# Patient Record
Sex: Female | Born: 1990 | Race: White | Hispanic: No | Marital: Married | Smoking: Never smoker
Health system: Southern US, Community
[De-identification: ages and names within clinical notes are randomized; demographics above are authoritative.]

## PROBLEM LIST (undated history)

## (undated) DIAGNOSIS — U071 COVID-19: Secondary | ICD-10-CM

## (undated) DIAGNOSIS — J302 Other seasonal allergic rhinitis: Secondary | ICD-10-CM

---
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
## 2011-12-03 ENCOUNTER — Encounter (HOSPITAL_COMMUNITY): Payer: Self-pay | Admitting: Emergency Medicine

## 2011-12-03 ENCOUNTER — Emergency Department (HOSPITAL_COMMUNITY)
Admission: EM | Admit: 2011-12-03 | Discharge: 2011-12-03 | Disposition: A | Discharge status: Home / Self Care | Payer: Worker's Compensation | Attending: Emergency Medicine | Admitting: Emergency Medicine

## 2011-12-03 DIAGNOSIS — M549 Dorsalgia, unspecified: Secondary | ICD-10-CM | POA: Insufficient documentation

## 2011-12-03 DIAGNOSIS — W19XXXA Unspecified fall, initial encounter: Secondary | ICD-10-CM

## 2011-12-03 HISTORY — DX: Other seasonal allergic rhinitis: J30.2

## 2011-12-03 MED ORDER — IBUPROFEN 800 MG PO TABS: 800.0000 mg | ORAL_TABLET | Freq: Three times a day (TID) | ORAL | Status: AC

## 2011-12-03 MED ORDER — HYDROCODONE-ACETAMINOPHEN 5-500 MG PO TABS: 1.0000 | ORAL_TABLET | Freq: Four times a day (QID) | ORAL | Status: AC | PRN

## 2011-12-03 NOTE — ED Provider Notes (Signed)
Medical screening examination/treatment/procedure(s) were performed by non-physician practitioner and as supervising physician I was immediately available for consultation/collaboration.  Juliet Rude. Rubin Payor, MD 12/03/11 2317

## 2011-12-03 NOTE — ED Notes (Signed)
Pt with lab tech for urine drug screen.

## 2011-12-03 NOTE — ED Provider Notes (Signed)
History     CSN: [id number]  Arrival date & time 12/03/11  2038   First MD Initiated Contact with Patient 12/03/11 2052      Chief Complaint  Patient presents with  . Back Pain  . Fall    (Consider location/radiation/quality/duration/timing/severity/associated sxs/prior treatment) HPI Comments: Pt states that she was coming around the corner at work and she fell and landed on her right back:pt c/o pain in the area without radiation  Patient is a 21 y.o. female presenting with back pain. The history is provided by the patient. No language interpreter was used.  Back Pain  This is a new problem. The current episode started 1 to 2 hours ago. The problem occurs constantly. The problem has not changed since onset.The pain is associated with falling. The pain is present in the lumbar spine. The quality of the pain is described as aching. The pain does not radiate. The pain is moderate. The symptoms are aggravated by certain positions. Pertinent negatives include no numbness, no bowel incontinence, no dysuria, no paresthesias, no paresis and no weakness. She has tried nothing for the symptoms.    Past Medical History  Diagnosis Date  . Seasonal allergies     History reviewed. No pertinent past surgical history.  No family history on file.  History  Substance Use Topics  . Smoking status: Never Smoker   . Smokeless tobacco: Not on file  . Alcohol Use: No    OB History    Grav Para Term Preterm Abortions TAB SAB Ect Mult Living                  Review of Systems  Respiratory: Negative.   Cardiovascular: Negative.   Gastrointestinal: Negative for bowel incontinence.  Genitourinary: Negative for dysuria.  Musculoskeletal: Positive for back pain.  Neurological: Negative for weakness, numbness and paresthesias.    Allergies  Amoxicillin  Home Medications   Current Outpatient Rx  Name Route Sig Dispense Refill  . LORATADINE 10 MG PO TABS Oral Take 10 mg by mouth daily.     Marland Kitchen ONDANSETRON HCL 4 MG PO TABS Oral Take 4 mg by mouth every 8 (eight) hours as needed. For nausea      BP 132/80  Pulse 105  Temp(Src) 98 F (36.7 C) (Oral)  Resp 20  SpO2 100%  LMP 11/26/2011  Physical Exam  Nursing note and vitals reviewed. Constitutional: She is oriented to person, place, and time. She appears well-developed and well-nourished.  Cardiovascular: Normal rate and regular rhythm.   Pulmonary/Chest: Effort normal and breath sounds normal.  Musculoskeletal:       Arms: Neurological: She is alert and oriented to person, place, and time.  Skin: Skin is warm and dry.    ED Course  Procedures (including critical care time)  Labs Reviewed - No data to display No results found.   1. Fall   2. Back pain       MDM  Pt not having any deficit:dont think imaging is needed is needed at this time as pt is having paraspinal tenderness:pt had uds done:will treat symptomatically for pain        Teressa Lower, NP 12/03/11 2247

## 2011-12-03 NOTE — ED Notes (Signed)
Pt slipped and fell in kitchen at work just pta.  States L foot slipped out in front of her and she landed on R back.  C/o pain to R lower back and L ankle that radiates up posterior L lower leg.Ambulatory to triage.  Denies LOC.

## 2011-12-03 NOTE — Discharge Instructions (Signed)
Back Exercises Back exercises help treat and prevent back injuries. The goal of back exercises is to increase the strength of your abdominal and back muscles and the flexibility of your back. These exercises should be started when you no longer have back pain. Back exercises include:  Pelvic Tilt. Lie on your back with your knees bent. Tilt your pelvis until the lower part of your back is against the floor. Hold this position 5 to 10 sec and repeat 5 to 10 times.   Knee to Chest. Pull first 1 knee up against your chest and hold for 20 to 30 seconds, repeat this with the other knee, and then both knees. This may be done with the other leg straight or bent, whichever feels better.   Sit-Ups or Curl-Ups. Bend your knees 90 degrees. Start with tilting your pelvis, and do a partial, slow sit-up, lifting your trunk only 30 to 45 degrees off the floor. Take at least 2 to 3 seconds for each sit-up. Do not do sit-ups with your knees out straight. If partial sit-ups are difficult, simply do the above but with only tightening your abdominal muscles and holding it as directed.   Hip-Lift. Lie on your back with your knees flexed 90 degrees. Push down with your feet and shoulders as you raise your hips a couple inches off the floor; hold for 10 seconds, repeat 5 to 10 times.   Back arches. Lie on your stomach, propping yourself up on bent elbows. Slowly press on your hands, causing an arch in your low back. Repeat 3 to 5 times. Any initial stiffness and discomfort should lessen with repetition over time.   Shoulder-Lifts. Lie face down with arms beside your body. Keep hips and torso pressed to floor as you slowly lift your head and shoulders off the floor.  Do not overdo your exercises, especially in the beginning. Exercises may cause you some mild back discomfort which lasts for a few minutes; however, if the pain is more severe, or lasts for more than 15 minutes, do not continue exercises until you see your  caregiver. Improvement with exercise therapy for back problems is slow.  See your caregivers for assistance with developing a proper back exercise program. Document Released: 08/24/2004 Document Revised: 07/06/2011 Document Reviewed: 07/17/2005 ExitCare Patient Information 2012 [employer]. 

## 2019-01-28 DIAGNOSIS — N970 Female infertility associated with anovulation: Secondary | ICD-10-CM | POA: Insufficient documentation

## 2019-01-29 HISTORY — PX: APPENDECTOMY: SHX54

## 2019-08-01 DIAGNOSIS — F419 Anxiety disorder, unspecified: Secondary | ICD-10-CM | POA: Insufficient documentation

## 2019-08-01 DIAGNOSIS — F32A Depression, unspecified: Secondary | ICD-10-CM | POA: Insufficient documentation

## 2019-08-01 HISTORY — DX: Anxiety disorder, unspecified: F41.9

## 2019-08-01 HISTORY — DX: Depression, unspecified: F32.A

## 2019-08-19 ENCOUNTER — Ambulatory Visit: Payer: Self-pay | Admitting: Family Medicine

## 2019-08-26 ENCOUNTER — Ambulatory Visit: Payer: Self-pay | Admitting: Family Medicine

## 2019-09-22 ENCOUNTER — Encounter: Payer: Self-pay | Admitting: Family Medicine

## 2019-09-22 ENCOUNTER — Other Ambulatory Visit: Payer: Self-pay

## 2019-09-22 ENCOUNTER — Ambulatory Visit (INDEPENDENT_AMBULATORY_CARE_PROVIDER_SITE_OTHER): Payer: 59 | Admitting: Family Medicine

## 2019-09-22 VITALS — BP 112/68 | HR 107 | Temp 97.9°F | Ht <= 58 in | Wt 148.8 lb

## 2019-09-22 DIAGNOSIS — M545 Low back pain, unspecified: Secondary | ICD-10-CM

## 2019-09-22 DIAGNOSIS — Z23 Encounter for immunization: Secondary | ICD-10-CM | POA: Diagnosis not present

## 2019-09-22 DIAGNOSIS — Z0000 Encounter for general adult medical examination without abnormal findings: Secondary | ICD-10-CM

## 2019-09-22 LAB — URINALYSIS, ROUTINE W REFLEX MICROSCOPIC
Bilirubin Urine: NEGATIVE
Hgb urine dipstick: NEGATIVE
Ketones, ur: NEGATIVE
Leukocytes,Ua: NEGATIVE
Nitrite: NEGATIVE
RBC / HPF: NONE SEEN (ref 0–?)
Specific Gravity, Urine: 1.015 (ref 1.000–1.030)
Total Protein, Urine: NEGATIVE
Urine Glucose: NEGATIVE
Urobilinogen, UA: 0.2 (ref 0.0–1.0)
WBC, UA: NONE SEEN (ref 0–?)
pH: 7.5 (ref 5.0–8.0)

## 2019-09-22 NOTE — Progress Notes (Addendum)
New Patient Office Visit  Subjective:  Patient ID: Claudia Davis, female    DOB: 1991/04/29  Age: 29 y.o. MRN: [id number]  CC:  Chief Complaint  Patient presents with  . Establish Care    New patient, c/o lower back pain x 5 months. Per pt she feels like there is something going on tih her kidneys.     HPI SHARICA ROEDEL presents for a 3 to 41-month history of intermittent lower back pain urinary frequency and urgency.  Back pain occasionally radiates around her thighs into her lower abdominal area.  There is no radiation of pain down the legs numbness tingling or weakness.  Patient denies dysuria unusual vaginal discharge nausea vomiting fever chills.  There is been no injury to her lower back.  She is currently not using any form of contraception, LMP 2/14.  Pregnancy would be okay at this point.  She admits to feeling anxious and somewhat depressed with the pandemic.  Mom suffers from anxiety and depression and is currently taking medication.  Patient works at [employer].  Past Medical History:  Diagnosis Date  . Seasonal allergies     Past Surgical History:  Procedure Laterality Date  . APPENDECTOMY  01/2019    Family History  Problem Relation Age of Onset  . Anxiety disorder Mother   . Hypertension Father     Social History   Socioeconomic History  . Marital status: Married    Spouse name: Not on file  . Number of children: Not on file  . Years of education: Not on file  . Highest education level: Not on file  Occupational History  . Not on file  Tobacco Use  . Smoking status: Never Smoker  Substance and Sexual Activity  . Alcohol use: No  . Drug use: No  . Sexual activity: Yes  Other Topics Concern  . Not on file  Social History Narrative  . Not on file   Social Determinants of Health   Financial Resource Strain:   . Difficulty of Paying Living Expenses: Not on file  Food Insecurity:   . Worried About Charity fundraiser in the Last Year: Not on  file  . Ran Out of Food in the Last Year: Not on file  Transportation Needs:   . Lack of Transportation (Medical): Not on file  . Lack of Transportation (Non-Medical): Not on file  Physical Activity:   . Days of Exercise per Week: Not on file  . Minutes of Exercise per Session: Not on file  Stress:   . Feeling of Stress : Not on file  Social Connections:   . Frequency of Communication with Friends and Family: Not on file  . Frequency of Social Gatherings with Friends and Family: Not on file  . Attends Religious Services: Not on file  . Active Member of Clubs or Organizations: Not on file  . Attends Archivist Meetings: Not on file  . Marital Status: Not on file  Intimate Partner Violence:   . Fear of Current or Ex-Partner: Not on file  . Emotionally Abused: Not on file  . Physically Abused: Not on file  . Sexually Abused: Not on file    ROS Review of Systems  Constitutional: Negative for diaphoresis, fatigue, fever and unexpected weight change.  Eyes: Negative for photophobia and visual disturbance.  Respiratory: Negative.   Cardiovascular: Negative.   Genitourinary: Positive for frequency and urgency. Negative for difficulty urinating, dysuria, hematuria and vaginal discharge.  Musculoskeletal:  Positive for back pain. Negative for myalgias.  Skin: Negative.   Neurological: Negative for weakness and numbness.  Psychiatric/Behavioral: Positive for dysphoric mood. The patient is nervous/anxious.    Depression screen [facility] 2/9 09/22/2019  Decreased Interest 1  Down, Depressed, Hopeless 1  PHQ - 2 Score 2  Altered sleeping 1  Tired, decreased energy 1  Change in appetite 0  Feeling bad or failure about yourself  0  Trouble concentrating 0  Moving slowly or fidgety/restless 0  Suicidal thoughts 0  PHQ-9 Score 4  Difficult doing work/chores Somewhat difficult    Objective:   Today's Vitals: BP 112/68   Pulse (!) 107   Temp 97.9 F (36.6 C) (Tympanic)   Ht 4'  10" (1.473 m)   Wt 148 lb 12.8 oz (67.5 kg)   SpO2 99%   BMI 31.10 kg/m   Physical Exam Vitals and nursing note reviewed.  Constitutional:      General: She is not in acute distress.    Appearance: Normal appearance. She is normal weight. She is not ill-appearing, toxic-appearing or diaphoretic.  HENT:     Head: Normocephalic and atraumatic.     Right Ear: External ear normal.     Left Ear: External ear normal.  Eyes:     General: No scleral icterus.       Right eye: No discharge.        Left eye: No discharge.  Cardiovascular:     Rate and Rhythm: Normal rate and regular rhythm.  Pulmonary:     Effort: Pulmonary effort is normal.     Breath sounds: Normal breath sounds.  Abdominal:     General: Abdomen is flat. Bowel sounds are normal. There is no distension.     Palpations: Abdomen is soft. There is no mass.     Tenderness: There is no abdominal tenderness. There is no guarding or rebound.     Hernia: No hernia is present.  Musculoskeletal:     Lumbar back: No swelling, edema, deformity, signs of trauma, lacerations or spasms. Normal range of motion. No scoliosis.  Neurological:     Mental Status: She is alert and oriented to person, place, and time.     Motor: Motor function is intact.     Comments: Neg dural tension signs.   Psychiatric:        Mood and Affect: Mood normal.        Behavior: Behavior normal.     Assessment & Plan:   Problem List Items Addressed This Visit    None    Visit Diagnoses    Need for Tdap vaccination    -  Primary   Relevant Orders   Tdap vaccine greater than or equal to 7yo IM (Completed)   Midline low back pain without sciatica, unspecified chronicity       Relevant Orders   Urinalysis, Routine w reflex microscopic (Completed)   Urine Culture   Pregnancy, urine (Completed)   Healthcare maintenance       Relevant Medications   Prenatal Multivit-Min-Fe-FA (PRE-NATAL FORMULA) TABS   Other Relevant Orders   Ambulatory referral to  Obstetrics / Gynecology      Outpatient Encounter Medications as of 09/22/2019  Medication Sig  . loratadine (CLARITIN) 10 MG tablet Take 10 mg by mouth daily.  . ondansetron (ZOFRAN) 4 MG tablet Take 4 mg by mouth every 8 (eight) hours as needed. For nausea  . Prenatal Multivit-Min-Fe-FA (PRE-NATAL FORMULA) TABS Take one daily.  No facility-administered encounter medications on file as of 09/22/2019.    Follow-up: Return in about 2 weeks (around 10/06/2019).   Mliss Sax, MD

## 2019-09-23 ENCOUNTER — Telehealth: Payer: Self-pay

## 2019-09-23 ENCOUNTER — Telehealth: Payer: Self-pay | Admitting: Family Medicine

## 2019-09-23 DIAGNOSIS — R110 Nausea: Secondary | ICD-10-CM

## 2019-09-23 LAB — URINE CULTURE
MICRO NUMBER:: 10173328
SPECIMEN QUALITY:: ADEQUATE

## 2019-09-23 LAB — PREGNANCY, URINE: Preg Test, Ur: NEGATIVE

## 2019-09-23 MED ORDER — PRE-NATAL FORMULA PO TABS: ORAL_TABLET | ORAL | 2 refills | Status: AC

## 2019-09-23 NOTE — Addendum Note (Signed)
Addended by: Andrez Grime on: 09/23/2019 09:50 AM   Modules accepted: Orders

## 2019-09-23 NOTE — Telephone Encounter (Signed)
Patient calling with questions regarding prenatal pills. Informed patient that the Rx was sent to the pharmacy because she had let doctor know that she was working on getting pregnant pt understood this. States that she would like Zofran sent to pharmacy for nausea. Please advise.

## 2019-09-23 NOTE — Telephone Encounter (Signed)
Patient is returning the call for results. CB is [phone]

## 2019-09-24 MED ORDER — ONDANSETRON HCL 4 MG PO TABS: 4.0000 mg | ORAL_TABLET | Freq: Three times a day (TID) | ORAL | 0 refills | Status: DC | PRN

## 2019-09-24 NOTE — Telephone Encounter (Signed)
Done

## 2019-10-06 ENCOUNTER — Ambulatory Visit: Payer: 59 | Admitting: Family Medicine

## 2019-10-06 ENCOUNTER — Other Ambulatory Visit: Payer: Self-pay

## 2019-10-06 ENCOUNTER — Encounter: Payer: Self-pay | Admitting: Family Medicine

## 2019-10-06 VITALS — BP 102/68 | HR 93 | Temp 98.6°F | Ht <= 58 in | Wt 147.0 lb

## 2019-10-06 DIAGNOSIS — F4329 Adjustment disorder with other symptoms: Secondary | ICD-10-CM | POA: Insufficient documentation

## 2019-10-06 NOTE — Patient Instructions (Signed)
Mindfulness-Based Stress Reduction Mindfulness-based stress reduction (MBSR) is a program that helps people learn to practice mindfulness. Mindfulness is the practice of intentionally paying attention to the present moment. It can be learned and practiced through techniques such as education, breathing exercises, meditation, and yoga. MBSR includes several mindfulness techniques in one program. MBSR works best when you understand the treatment, are willing to try new things, and can commit to spending time practicing what you learn. MBSR training may include learning about:  How your emotions, thoughts, and reactions affect your body.  New ways to respond to things that cause negative thoughts to start (triggers).  How to notice your thoughts and let go of them.  Practicing awareness of everyday things that you normally do without thinking.  The techniques and goals of different types of meditation. What are the benefits of MBSR? MBSR can have many benefits, which include helping you to:  Develop self-awareness. This refers to knowing and understanding yourself.  Learn skills and attitudes that help you to participate in your own health care.  Learn new ways to care for yourself.  Be more accepting about how things are, and let things go.  Be less judgmental and approach things with an open mind.  Be patient with yourself and trust yourself more. MBSR has also been shown to:  Reduce negative emotions, such as depression and anxiety.  Improve memory and focus.  Change how you sense and approach pain.  Boost your body's ability to fight infections.  Help you connect better with other people.  Improve your sense of well-being. Follow these instructions at home:   Find a local in-person or online MBSR program.  Set aside some time regularly for mindfulness practice.  Find a mindfulness practice that works best for you. This may include one or more of the following: ?  Meditation. Meditation involves focusing your mind on a certain thought or activity. ? Breathing awareness exercises. These help you to stay present by focusing on your breath. ? Body scan. For this practice, you lie down and pay attention to each part of your body from head to toe. You can identify tension and soreness and intentionally relax parts of your body. ? Yoga. Yoga involves stretching and breathing, and it can improve your ability to move and be flexible. It can also provide an experience of testing your body's limits, which can help you release stress. ? Mindful eating. This way of eating involves focusing on the taste, texture, color, and smell of each bite of food. Because this slows down eating and helps you feel full sooner, it can be an important part of a weight-loss plan.  Find a podcast or recording that provides guidance for breathing awareness, body scan, or meditation exercises. You can listen to these any time when you have a free moment to rest without distractions.  Follow your treatment plan as told by your health care provider. This may include taking regular medicines and making changes to your diet or lifestyle as recommended. How to practice mindfulness To do a basic awareness exercise:  Find a comfortable place to sit.  Pay attention to the present moment. Observe your thoughts, feelings, and surroundings just as they are.  Avoid placing judgment on yourself, your feelings, or your surroundings. Make note of any judgment that comes up, and let it go.  Your mind may wander, and that is okay. Make note of when your thoughts drift, and return your attention to the present moment. To do   basic mindfulness meditation:  Find a comfortable place to sit. This may include a stable chair or a firm floor cushion. ? Sit upright with your back straight. Let your arms fall next to your side with your hands resting on your legs. ? If sitting in a chair, rest your feet flat on  the floor. ? If sitting on a cushion, cross your legs in front of you.  Keep your head in a neutral position with your chin dropped slightly. Relax your jaw and rest the tip of your tongue on the roof of your mouth. Drop your gaze to the floor. You can close your eyes if you like.  Breathe normally and pay attention to your breath. Feel the air moving in and out of your nose. Feel your belly expanding and relaxing with each breath.  Your mind may wander, and that is okay. Make note of when your thoughts drift, and return your attention to your breath.  Avoid placing judgment on yourself, your feelings, or your surroundings. Make note of any judgment or feelings that come up, let them go, and bring your attention back to your breath.  When you are ready, lift your gaze or open your eyes. Pay attention to how your body feels after the meditation. Where to find more information You can find more information about MBSR from:  Your health care provider.  Community-based meditation centers or programs.  Programs offered near you. Summary  Mindfulness-based stress reduction (MBSR) is a program that teaches you how to intentionally pay attention to the present moment. It is used with other treatments to help you cope better with daily stress, emotions, and pain.  MBSR focuses on developing self-awareness, which allows you to respond to life stress without judgment or negative emotions.  MBSR programs may involve learning different mindfulness practices, such as breathing exercises, meditation, yoga, body scan, or mindful eating. Find a mindfulness practice that works best for you, and set aside time for it on a regular basis. This information is not intended to replace advice given to you by your health care provider. Make sure you discuss any questions you have with your health care provider. Document Revised: 06/29/2017 Document Reviewed: 11/23/2016 Elsevier Patient Education  2020 [employer].  Exercising to Stay Healthy To become healthy and stay healthy, it is recommended that you do moderate-intensity and vigorous-intensity exercise. You can tell that you are exercising at a moderate intensity if your heart starts beating faster and you start breathing faster but can still hold a conversation. You can tell that you are exercising at a vigorous intensity if you are breathing much harder and faster and cannot hold a conversation while exercising. Exercising regularly is important. It has many health benefits, such as:  Improving overall fitness, flexibility, and endurance.  Increasing bone density.  Helping with weight control.  Decreasing body fat.  Increasing muscle strength.  Reducing stress and tension.  Improving overall health. How often should I exercise? Choose an activity that you enjoy, and set realistic goals. Your health care provider can help you make an activity plan that works for you. Exercise regularly as told by your health care provider. This may include:  Doing strength training two times a week, such as: ? Lifting weights. ? Using resistance bands. ? Push-ups. ? Sit-ups. ? Yoga.  Doing a certain intensity of exercise for a given amount of time. Choose from these options: ? A total of 150 minutes of moderate-intensity exercise every week. ? A  total of 75 minutes of vigorous-intensity exercise every week. ? A mix of moderate-intensity and vigorous-intensity exercise every week. Children, pregnant women, people who have not exercised regularly, people who are overweight, and older adults may need to talk with a health care provider about what activities are safe to do. If you have a medical condition, be sure to talk with your health care provider before you start a new exercise program. What are some exercise ideas? Moderate-intensity exercise ideas include:  Walking 1 mile (1.6 km) in about 15 minutes.  Biking.  Hiking.  Golfing.   Dancing.  Water aerobics. Vigorous-intensity exercise ideas include:  Walking 4.5 miles (7.2 km) or more in about 1 hour.  Jogging or running 5 miles (8 km) in about 1 hour.  Biking 10 miles (16.1 km) or more in about 1 hour.  Lap swimming.  Roller-skating or in-line skating.  Cross-country skiing.  Vigorous competitive sports, such as football, basketball, and soccer.  Jumping rope.  Aerobic dancing. What are some everyday activities that can help me to get exercise?  [place] work, such as: ? Pushing a Conservation officer, nature. ? Raking and bagging leaves.  Washing your car.  Pushing a stroller.  Shoveling snow.  Gardening.  Washing windows or floors. How can I be more active in my day-to-day activities?  Use stairs instead of an elevator.  Take a walk during your lunch break.  If you drive, park your car farther away from your work or school.  If you take public transportation, get off one stop early and walk the rest of the way.  Stand up or walk around during all of your indoor phone calls.  Get up, stretch, and walk around every 30 minutes throughout the day.  Enjoy exercise with a friend. Support to continue exercising will help you keep a regular routine of activity. What guidelines can I follow while exercising?  Before you start a new exercise program, talk with your health care provider.  Do not exercise so much that you hurt yourself, feel dizzy, or get very short of breath.  Wear comfortable clothes and wear shoes with good support.  Drink plenty of water while you exercise to prevent dehydration or heat stroke.  Work out until your breathing and your heartbeat get faster. Where to find more information  [place].S[place] Department of Health and Human Services: [employer]  Centers for Disease Control and Prevention (CDC): http://www.wolf.info/ Summary  Exercising regularly is important. It will improve your overall fitness, flexibility, and endurance.  Regular exercise  also will improve your overall health. It can help you control your weight, reduce stress, and improve your bone density.  Do not exercise so much that you hurt yourself, feel dizzy, or get very short of breath.  Before you start a new exercise program, talk with your health care provider. This information is not intended to replace advice given to you by your health care provider. Make sure you discuss any questions you have with your health care provider. Document Revised: 06/29/2017 Document Reviewed: 06/07/2017 Elsevier Patient Education  2020 [employer].

## 2019-10-06 NOTE — Progress Notes (Signed)
Established Patient Office Visit  Subjective:  Patient ID: Claudia Davis, female    DOB: 20-Apr-1991  Age: 29 y.o. MRN: [id number]  CC:  Chief Complaint  Patient presents with  . Follow-up    2 week follow up patient states that her back pains have subsided she would like to start on somethng for anxiety.     HPI [name] presents for follow-up of her back pain and stress.  Back pain has improved.  She would like to discuss treatment options for her stress and anxiety.  This is been more recent problem for her as an adult.  She is married and lives with her husband.  They have no children.  They are currently trying for pregnancy.  Husband works out of town throughout the week and is home on the weekends.  She is otherwise alone but recognizes this is of stressor for her.  There are no problems with the marriage she tells me.  Her mom has problems with stress and anxiety as well.  Patient does not smoke, drink alcohol or use illicit drugs.  She works full-time at [employer].  Past Medical History:  Diagnosis Date  . Seasonal allergies     Past Surgical History:  Procedure Laterality Date  . APPENDECTOMY  01/2019    Family History  Problem Relation Age of Onset  . Anxiety disorder Mother   . Hypertension Father     Social History   Socioeconomic History  . Marital status: Married    Spouse name: Not on file  . Number of children: Not on file  . Years of education: Not on file  . Highest education level: Not on file  Occupational History  . Not on file  Tobacco Use  . Smoking status: Never Smoker  Substance and Sexual Activity  . Alcohol use: No  . Drug use: No  . Sexual activity: Yes  Other Topics Concern  . Not on file  Social History Narrative  . Not on file   Social Determinants of Health   Financial Resource Strain:   . Difficulty of Paying Living Expenses: Not on file  Food Insecurity:   . Worried About Charity fundraiser in the Last Year:  Not on file  . Ran Out of Food in the Last Year: Not on file  Transportation Needs:   . Lack of Transportation (Medical): Not on file  . Lack of Transportation (Non-Medical): Not on file  Physical Activity:   . Days of Exercise per Week: Not on file  . Minutes of Exercise per Session: Not on file  Stress:   . Feeling of Stress : Not on file  Social Connections:   . Frequency of Communication with Friends and Family: Not on file  . Frequency of Social Gatherings with Friends and Family: Not on file  . Attends Religious Services: Not on file  . Active Member of Clubs or Organizations: Not on file  . Attends Archivist Meetings: Not on file  . Marital Status: Not on file  Intimate Partner Violence:   . Fear of Current or Ex-Partner: Not on file  . Emotionally Abused: Not on file  . Physically Abused: Not on file  . Sexually Abused: Not on file    Outpatient Medications Prior to Visit  Medication Sig Dispense Refill  . loratadine (CLARITIN) 10 MG tablet Take 10 mg by mouth daily.    . ondansetron (ZOFRAN) 4 MG tablet Take 1 tablet (4 mg  total) by mouth every 8 (eight) hours as needed. For nausea 20 tablet 0  . Prenatal Multivit-Min-Fe-FA (PRE-NATAL FORMULA) TABS Take one daily. 30 tablet 2   No facility-administered medications prior to visit.    Allergies  Allergen Reactions  . Amoxicillin Swelling    Throat swells     ROS Review of Systems  Constitutional: Negative.   HENT: Negative.   Respiratory: Negative.   Cardiovascular: Negative.   Gastrointestinal: Negative.   Genitourinary: Negative.   Musculoskeletal: Negative for back pain, gait problem and myalgias.  Neurological: Negative for weakness and numbness.  Psychiatric/Behavioral: Positive for dysphoric mood. The patient is nervous/anxious.       Objective:    Physical Exam  Constitutional: She is oriented to person, place, and time. She appears well-developed and well-nourished.  HENT:  Head:  Atraumatic.  Right Ear: External ear normal.  Left Ear: External ear normal.  Eyes: Conjunctivae are normal. Right eye exhibits no discharge. Left eye exhibits no discharge. No scleral icterus.  Neck: No JVD present. No tracheal deviation present.  Pulmonary/Chest: Effort normal. No stridor.  Neurological: She is alert and oriented to person, place, and time.  Psychiatric: She has a normal mood and affect. Her behavior is normal.    BP 102/68   Pulse 93   Temp 98.6 F (37 C) (Tympanic)   Ht '4\' 10"'$  (1.473 m)   Wt 147 lb (66.7 kg)   SpO2 100%   BMI 30.72 kg/m  Wt Readings from Last 3 Encounters:  10/06/19 147 lb (66.7 kg)  09/22/19 148 lb 12.8 oz (67.5 kg)     Health Maintenance Due  Topic Date Due  . HIV Screening  09/05/2006  . PAP-Cervical Cytology Screening  09/05/2012  . PAP SMEAR-Modifier  09/05/2012    There are no preventive care reminders to display for this patient.  No results found for: TSH No results found for: WBC, HGB, HCT, MCV, PLT No results found for: NA, K, CHLORIDE, CO2, GLUCOSE, BUN, CREATININE, BILITOT, ALKPHOS, AST, ALT, PROT, ALBUMIN, CALCIUM, ANIONGAP, EGFR, GFR No results found for: CHOL No results found for: HDL No results found for: LDLCALC No results found for: TRIG No results found for: CHOLHDL No results found for: HGBA1C    Assessment & Plan:   Problem List Items Addressed This Visit      Other   Stress and adjustment reaction - Primary   Relevant Orders   Ambulatory referral to Psychology      No orders of the defined types were placed in this encounter.   Follow-up: Return in about 3 months (around 01/06/2020).   Patient was given information on mindfulness based stress reduction as well as exercise.  Encouraged her to pursue these options.  She also agrees to pursue talking therapy.  Follow-up will be in 3 months.  Libby Maw, MD

## 2019-11-19 ENCOUNTER — Other Ambulatory Visit: Payer: Self-pay

## 2019-11-19 ENCOUNTER — Encounter: Payer: 59 | Admitting: Obstetrics & Gynecology

## 2019-11-20 ENCOUNTER — Encounter: Payer: Self-pay | Admitting: Family Medicine

## 2019-11-20 ENCOUNTER — Ambulatory Visit (INDEPENDENT_AMBULATORY_CARE_PROVIDER_SITE_OTHER): Payer: 59 | Admitting: Family Medicine

## 2019-11-20 VITALS — BP 114/72 | HR 87 | Temp 98.2°F | Ht <= 58 in | Wt 144.8 lb

## 2019-11-20 DIAGNOSIS — F418 Other specified anxiety disorders: Secondary | ICD-10-CM | POA: Diagnosis not present

## 2019-11-20 MED ORDER — FLUOXETINE HCL 10 MG PO CAPS: ORAL_CAPSULE | ORAL | 1 refills | Status: DC

## 2019-11-20 NOTE — Progress Notes (Signed)
Established Patient Office Visit  Subjective:  Patient ID: Claudia Davis, female    DOB: July 26, 1991  Age: 29 y.o. MRN: [id number]  CC:  Chief Complaint  Patient presents with  . Anxiety    anxiety attacks more frequent pt states that she have had 2 episodes in 2 days.     HPI Claudia Davis presents for evaluation treatment of worsening anxiety and depression.  Seems to have been set off by the death of a close coworker about 6 weeks ago.  Patient reports worsening depression anxiety and mental duress.  She actually had to leave work 1 day.  She is currently seeing a Event organiser on a regular basis.  She has follow-up scheduled with her GYN provider this coming week.  She reports lack of motivation, interest and energy to complete her daily tasks.  Past Medical History:  Diagnosis Date  . Seasonal allergies     Past Surgical History:  Procedure Laterality Date  . APPENDECTOMY  01/2019    Family History  Problem Relation Age of Onset  . Anxiety disorder Mother   . Hypertension Father     Social History   Socioeconomic History  . Marital status: Married    Spouse name: Not on file  . Number of children: Not on file  . Years of education: Not on file  . Highest education level: Not on file  Occupational History  . Not on file  Tobacco Use  . Smoking status: Never Smoker  Substance and Sexual Activity  . Alcohol use: No  . Drug use: No  . Sexual activity: Yes  Other Topics Concern  . Not on file  Social History Narrative  . Not on file   Social Determinants of Health   Financial Resource Strain:   . Difficulty of Paying Living Expenses:   Food Insecurity:   . Worried About Charity fundraiser in the Last Year:   . Arboriculturist in the Last Year:   Transportation Needs:   . Film/video editor (Medical):   Marland Kitchen Lack of Transportation (Non-Medical):   Physical Activity:   . Days of Exercise per Week:   . Minutes of Exercise per Session:    Stress:   . Feeling of Stress :   Social Connections:   . Frequency of Communication with Friends and Family:   . Frequency of Social Gatherings with Friends and Family:   . Attends Religious Services:   . Active Member of Clubs or Organizations:   . Attends Archivist Meetings:   Marland Kitchen Marital Status:   Intimate Partner Violence:   . Fear of Current or Ex-Partner:   . Emotionally Abused:   Marland Kitchen Physically Abused:   . Sexually Abused:     Outpatient Medications Prior to Visit  Medication Sig Dispense Refill  . loratadine (CLARITIN) 10 MG tablet Take 10 mg by mouth daily.    . Prenatal Multivit-Min-Fe-FA (PRE-NATAL FORMULA) TABS Take one daily. 30 tablet 2  . ondansetron (ZOFRAN) 4 MG tablet Take 1 tablet (4 mg total) by mouth every 8 (eight) hours as needed. For nausea (Patient not taking: Reported on 11/20/2019) 20 tablet 0   No facility-administered medications prior to visit.    Allergies  Allergen Reactions  . Amoxicillin Swelling    Throat swells     ROS Review of Systems  Constitutional: Negative.   Respiratory: Negative.   Cardiovascular: Negative.   Gastrointestinal: Negative.   Psychiatric/Behavioral: Positive for dysphoric  mood. Negative for behavioral problems. The patient is nervous/anxious.    Depression screen [facility] 2/9 11/20/2019 11/20/2019 09/22/2019  Decreased Interest '3 3 1  '$ Down, Depressed, Hopeless '2 3 1  '$ PHQ - 2 Score '5 6 2  '$ Altered sleeping 3 - 1  Tired, decreased energy 3 - 1  Change in appetite 2 - 0  Feeling bad or failure about yourself  2 - 0  Trouble concentrating 1 - 0  Moving slowly or fidgety/restless 1 - 0  Suicidal thoughts 0 - 0  PHQ-9 Score 17 - 4  Difficult doing work/chores Very difficult - Somewhat difficult      Objective:    Physical Exam  Constitutional: She is oriented to person, place, and time. She appears well-developed and well-nourished. No distress.  HENT:  Head: Normocephalic and atraumatic.  Right Ear:  External ear normal.  Left Ear: External ear normal.  Eyes: Conjunctivae are normal. Right eye exhibits no discharge. Left eye exhibits no discharge. No scleral icterus.  Neck: No JVD present. No tracheal deviation present.  Pulmonary/Chest: Effort normal. No stridor.  Neurological: She is alert and oriented to person, place, and time.  Skin: She is not diaphoretic.  Psychiatric: She has a normal mood and affect. Her behavior is normal.    BP 114/72   Pulse 87   Temp 98.2 F (36.8 C) (Tympanic)   Ht '4\' 10"'$  (1.473 m)   Wt 144 lb 12.8 oz (65.7 kg)   SpO2 98%   BMI 30.26 kg/m  Wt Readings from Last 3 Encounters:  11/20/19 144 lb 12.8 oz (65.7 kg)  10/06/19 147 lb (66.7 kg)  09/22/19 148 lb 12.8 oz (67.5 kg)     Health Maintenance Due  Topic Date Due  . HIV Screening  Never done  . COVID-19 Vaccine (1) Never done  . PAP-Cervical Cytology Screening  Never done  . PAP SMEAR-Modifier  Never done    There are no preventive care reminders to display for this patient.  No results found for: TSH No results found for: WBC, HGB, HCT, MCV, PLT No results found for: NA, K, CHLORIDE, CO2, GLUCOSE, BUN, CREATININE, BILITOT, ALKPHOS, AST, ALT, PROT, ALBUMIN, CALCIUM, ANIONGAP, EGFR, GFR No results found for: CHOL No results found for: HDL No results found for: LDLCALC No results found for: TRIG No results found for: CHOLHDL No results found for: HGBA1C    Assessment & Plan:   Problem List Items Addressed This Visit      Other   Depression with anxiety - Primary   Relevant Medications   FLUoxetine (PROZAC) 10 MG capsule      Meds ordered this encounter  Medications  . FLUoxetine (PROZAC) 10 MG capsule    Sig: Take one every morning for one week and then increase to 2.    Dispense:  60 capsule    Refill:  1    Follow-up: Return in about 1 month (around 12/20/2019).  Patient will see GYN provider this coming week and agrees to delay pregnancy aspirations.  She will start  contraception.  She will discuss future pregnancy plans with GYN provider and coordinate.  Continue counseling.   Libby Maw, MD

## 2019-11-21 ENCOUNTER — Ambulatory Visit: Payer: 59 | Admitting: Family Medicine

## 2019-12-04 ENCOUNTER — Telehealth: Payer: Self-pay | Admitting: Family Medicine

## 2019-12-04 NOTE — Telephone Encounter (Signed)
May stay with the 10mg  dose for now.

## 2019-12-04 NOTE — Telephone Encounter (Signed)
Pt calling about FLUoxetine (PROZAC) 10 MG capsule, pt did one week taking one and she started taking 2 her second week and she noticed when she started the 2 she started feeling real sick and she didn't know if that was normal

## 2019-12-04 NOTE — Telephone Encounter (Signed)
Pt aware of recommendations and will F/U at Ov/thx dmf

## 2019-12-11 ENCOUNTER — Encounter: Payer: Self-pay | Admitting: Obstetrics & Gynecology

## 2019-12-11 ENCOUNTER — Other Ambulatory Visit: Payer: Self-pay

## 2019-12-11 ENCOUNTER — Ambulatory Visit (INDEPENDENT_AMBULATORY_CARE_PROVIDER_SITE_OTHER): Payer: 59 | Admitting: Obstetrics & Gynecology

## 2019-12-11 VITALS — BP 114/75 | HR 70 | Ht <= 58 in | Wt 141.0 lb

## 2019-12-11 DIAGNOSIS — N920 Excessive and frequent menstruation with regular cycle: Secondary | ICD-10-CM

## 2019-12-11 DIAGNOSIS — Z01419 Encounter for gynecological examination (general) (routine) without abnormal findings: Secondary | ICD-10-CM | POA: Diagnosis not present

## 2019-12-11 MED ORDER — TRANEXAMIC ACID 650 MG PO TABS: 1300.0000 mg | ORAL_TABLET | Freq: Three times a day (TID) | ORAL | 2 refills | Status: DC

## 2019-12-11 NOTE — Patient Instructions (Signed)
Tranexamic acid oral tablets What is this medicine? TRANEXAMIC ACID (TRAN ex AM ik AS id) slows down or stops blood clots from being broken down. This medicine is used to treat heavy monthly menstrual bleeding. This medicine may be used for other purposes; ask your health care provider or pharmacist if you have questions. COMMON BRAND NAME(S): Cyklokapron, Lysteda What should I tell my health care provider before I take this medicine? They need to know if you have any of these conditions:  bleeding in the brain  blood clotting problems  kidney disease  vision problems  an unusual allergic reaction to tranexamic acid, other medicines, foods, dyes, or preservatives  pregnant or trying to get pregnant  breast-feeding How should I use this medicine? Take this medicine by mouth with a glass of water. Follow the directions on the prescription label. Do not cut, crush, or chew this medicine. You can take it with or without food. If it upsets your stomach, take it with food. Take your medicine at regular intervals. Do not take it more often than directed. Do not stop taking except on your doctor's advice. Do not take this medicine until your period has started. Do not take it for more than 5 days in a row. Do not take this medicine when you do not have your period. Talk to your pediatrician regarding the use of this medicine in children. While this drug may be prescribed for female children as young as 12 years of age for selected conditions, precautions do apply. Overdosage: If you think you have taken too much of this medicine contact a poison control center or emergency room at once. NOTE: This medicine is only for you. Do not share this medicine with others. What if I miss a dose? If you miss a dose, take it when you remember, and then take your next dose at least 6 hours later. Do not take more than 2 tablets at a time to make up for missed doses. What may interact with this medicine? Do  not take this medicine with any of the following medications:  estrogens  birth control pills, patches, injections, rings or other devices that contain both an estrogen and a progestin This medicine may also interact with the following medications:  certain medicines used to help your blood clot  tretinoin (taken by mouth) This list may not describe all possible interactions. Give your health care provider a list of all the medicines, herbs, non-prescription drugs, or dietary supplements you use. Also tell them if you smoke, drink alcohol, or use illegal drugs. Some items may interact with your medicine. What should I watch for while using this medicine? Tell your doctor or healthcare professional if your symptoms do not start to get better or if they get worse. Tell your doctor or healthcare professional if you notice any eye problems while taking this medicine. Your doctor will refer you to an eye doctor who will examine your eyes. What side effects may I notice from receiving this medicine? Side effects that you should report to your doctor or health care professional as soon as possible:  allergic reactions like skin rash, itching or hives, swelling of the face, lips, or tongue  breathing difficulties  changes in vision  sudden or severe pain in the chest, legs, head, or groin  unusually weak or tired Side effects that usually do not require medical attention (report to your doctor or health care professional if they continue or are bothersome):  back pain    headache  muscle or joint aches  sinus and nasal problems  stomach pain  tiredness This list may not describe all possible side effects. Call your doctor for medical advice about side effects. You may report side effects to FDA at 1-800-FDA-1088. Where should I keep my medicine? Keep out of the reach of children. Store at room temperature between 15 and 30 degrees C (59 and 86 degrees F). Throw away any unused  medicine after the expiration date. NOTE: This sheet is a summary. It may not cover all possible information. If you have questions about this medicine, talk to your doctor, pharmacist, or health care provider.  2020 Elsevier/Gold Standard (2015-08-19 09:12:15)  

## 2019-12-11 NOTE — Progress Notes (Signed)
Subjective:     Claudia Davis is a 29 y.o. female here for a routine exam. Go LMP today Current complaints: Pt reports that she has started to have very heavy menses assoc with cramps. She has cycles that were worse than these prev and then was on Depo Provera. Her sx improved but, then she has amenorrhea. She reports that her cycles are not as heavy or as painful as they were years ago but, they are heavy and can cause a mess. She is on Prozac for the last 3 weeks for anxiety and she was told to stop her prenatal vitamins. She is married, sexually active and on no contraception.      Gynecologic History Patient's last menstrual period was 12/11/2019. Contraception: none Last Pap: 1 year prev at [facility] Last mammogram: n/a  Obstetric History OB History  Gravida Para Term Preterm AB Living  0 0 0 0 0 0  SAB TAB Ectopic Multiple Live Births  0 0 0 0 0   The following portions of the patient's history were reviewed and updated as appropriate: allergies, current medications, past family history, past medical history, past social history, past surgical history and problem list.  Review of Systems Pertinent items are noted in HPI.    Objective:  BP 114/75   Pulse 70   Ht 4\' 10"  (1.473 m)   Wt 141 lb (64 kg)   LMP 12/11/2019   BMI 29.47 kg/m      General Appearance:    Alert, cooperative, no distress, appears stated age  Head:    Normocephalic, without obvious abnormality, atraumatic  Eyes:    conjunctiva/corneas clear, EOM's intact, both eyes  Ears:    Normal external ear canals, both ears  Nose:   Nares normal, septum midline, mucosa normal, no drainage    or sinus tenderness  Throat:   Lips, mucosa, and tongue normal; teeth and gums normal  Neck:   Supple, symmetrical, trachea midline, no adenopathy;    thyroid:  no enlargement/tenderness/nodules  Back:     Symmetric, no curvature, ROM normal, no CVA tenderness  Lungs:     respirations unlabored  Chest Wall:    No tenderness  or deformity   Heart:    Regular rate and rhythm  Breast Exam:    No tenderness, masses, or nipple abnormality  Abdomen:     Soft, non-tender, bowel sounds active all four quadrants,    no masses, no organomegaly  Genitalia:    Normal female without lesion, discharge or tenderness   Large amount of blood in vault.   Extremities:   Extremities normal, atraumatic, no cyanosis or edema  Pulses:   2+ and symmetric all extremities  Skin:   Skin color, texture, turgor normal, no rashes or lesions    Assessment:    Healthy female exam.   AUB- heavy painful menses. Pt would like to become pregnant and does not want anything that might delay this from occurring.    Plan:   Lysteda 1500mg  1 po tid when cycles are heavy. Not to exceed 5 days per cycle.  F/u in 3 months to assess bleeding F/u in 1 year for annual Restart OCPs  Skie Vitrano L. Harraway-Smith, M.D., 12/13/2019

## 2019-12-22 ENCOUNTER — Encounter: Payer: Self-pay | Admitting: Family Medicine

## 2019-12-22 ENCOUNTER — Other Ambulatory Visit: Payer: Self-pay

## 2019-12-22 ENCOUNTER — Ambulatory Visit: Payer: 59 | Admitting: Family Medicine

## 2019-12-22 VITALS — BP 110/64 | HR 97 | Temp 97.9°F | Ht <= 58 in | Wt 143.0 lb

## 2019-12-22 DIAGNOSIS — F418 Other specified anxiety disorders: Secondary | ICD-10-CM

## 2019-12-22 MED ORDER — FLUOXETINE HCL 20 MG PO CAPS: 20.0000 mg | ORAL_CAPSULE | Freq: Every day | ORAL | 0 refills | Status: DC

## 2019-12-22 NOTE — Progress Notes (Signed)
Established Patient Office Visit  Subjective:  Patient ID: Claudia Davis, female    DOB: 10-27-1990  Age: 29 y.o. MRN: [id number]  CC:  Chief Complaint  Patient presents with  . Follow-up    1 month follow up on anxiety, no concerns.     HPI Claudia Davis presents for follow-up with her anxiety and depression.  Much, much improved with the Prozac.  Feels like a different person.  Mood is definitely elevated.  Friends and family have noticed.  She would like and needs to continue the medication.  She also is desiring a pregnancy.  She is not currently trying for pregnancy but if it happens that would be okay.  Status post recent follow-up with her OB/GYN.  Past Medical History:  Diagnosis Date  . Seasonal allergies     Past Surgical History:  Procedure Laterality Date  . APPENDECTOMY  01/2019    Family History  Problem Relation Age of Onset  . Anxiety disorder Mother   . Hypertension Father     Social History   Socioeconomic History  . Marital status: Married    Spouse name: Not on file  . Number of children: Not on file  . Years of education: Not on file  . Highest education level: Not on file  Occupational History  . Not on file  Tobacco Use  . Smoking status: Never Smoker  . Smokeless tobacco: Never Used  Substance and Sexual Activity  . Alcohol use: No  . Drug use: No  . Sexual activity: Yes    Birth control/protection: None  Other Topics Concern  . Not on file  Social History Narrative  . Not on file   Social Determinants of Health   Financial Resource Strain:   . Difficulty of Paying Living Expenses:   Food Insecurity:   . Worried About Charity fundraiser in the Last Year:   . Arboriculturist in the Last Year:   Transportation Needs:   . Film/video editor (Medical):   Marland Kitchen Lack of Transportation (Non-Medical):   Physical Activity:   . Days of Exercise per Week:   . Minutes of Exercise per Session:   Stress:   . Feeling of Stress :     Social Connections:   . Frequency of Communication with Friends and Family:   . Frequency of Social Gatherings with Friends and Family:   . Attends Religious Services:   . Active Member of Clubs or Organizations:   . Attends Archivist Meetings:   Marland Kitchen Marital Status:   Intimate Partner Violence:   . Fear of Current or Ex-Partner:   . Emotionally Abused:   Marland Kitchen Physically Abused:   . Sexually Abused:     Outpatient Medications Prior to Visit  Medication Sig Dispense Refill  . loratadine (CLARITIN) 10 MG tablet Take 10 mg by mouth daily.    Marland Kitchen FLUoxetine (PROZAC) 10 MG capsule Take one every morning for one week and then increase to 2. 60 capsule 1  . ondansetron (ZOFRAN) 4 MG tablet Take 1 tablet (4 mg total) by mouth every 8 (eight) hours as needed. For nausea (Patient not taking: Reported on 11/20/2019) 20 tablet 0  . Prenatal Multivit-Min-Fe-FA (PRE-NATAL FORMULA) TABS Take one daily. (Patient not taking: Reported on 12/11/2019) 30 tablet 2  . tranexamic acid (LYSTEDA) 650 MG TABS tablet Take 2 tablets (1,300 mg total) by mouth 3 (three) times daily. Take during menses for a maximum of five  days (Patient not taking: Reported on 12/22/2019) 30 tablet 2   No facility-administered medications prior to visit.    Allergies  Allergen Reactions  . Amoxicillin Swelling    Throat swells     ROS Review of Systems  Constitutional: Negative.   HENT: Negative.   Eyes: Negative for photophobia and visual disturbance.  Respiratory: Negative.   Cardiovascular: Negative.   Gastrointestinal: Negative.   Psychiatric/Behavioral: Negative for dysphoric mood. The patient is not nervous/anxious.    Depression screen [facility] 2/9 12/22/2019 11/20/2019 11/20/2019  Decreased Interest 0 3 3  Down, Depressed, Hopeless 0 2 3  PHQ - 2 Score 0 5 6  Altered sleeping 1 3 -  Tired, decreased energy 0 3 -  Change in appetite 0 2 -  Feeling bad or failure about yourself  0 2 -  Trouble concentrating 0 1 -   Moving slowly or fidgety/restless 0 1 -  Suicidal thoughts 0 0 -  PHQ-9 Score 1 17 -  Difficult doing work/chores Somewhat difficult Very difficult -       Objective:    Physical Exam  Constitutional: She is oriented to person, place, and time. She appears well-developed and well-nourished. No distress.  HENT:  Head: Normocephalic and atraumatic.  Right Ear: External ear normal.  Left Ear: External ear normal.  Mouth/Throat: Oropharynx is clear and moist.  Eyes: Conjunctivae are normal. Right eye exhibits no discharge. Left eye exhibits no discharge. No scleral icterus.  Neck: No JVD present. No tracheal deviation present.  Pulmonary/Chest: Effort normal. No stridor.  Neurological: She is alert and oriented to person, place, and time.  Skin: Skin is warm and dry. She is not diaphoretic.  Psychiatric: She has a normal mood and affect. Her behavior is normal.    BP 110/64   Pulse 97   Temp 97.9 F (36.6 C) (Tympanic)   Ht _0  (1.473 m)   Wt 143 lb (64.9 kg)   LMP 12/11/2019   SpO2 98%   BMI 29.89 kg/m  Wt Readings from Last 3 Encounters:  12/22/19 143 lb (64.9 kg)  12/11/19 141 lb (64 kg)  11/20/19 144 lb 12.8 oz (65.7 kg)     Health Maintenance Due  Topic Date Due  . HIV Screening  Never done  . PAP-Cervical Cytology Screening  Never done  . PAP SMEAR-Modifier  Never done    There are no preventive care reminders to display for this patient.  No results found for: TSH No results found for: WBC, HGB, HCT, MCV, PLT No results found for: NA, K, CHLORIDE, CO2, GLUCOSE, BUN, CREATININE, BILITOT, ALKPHOS, AST, ALT, PROT, ALBUMIN, CALCIUM, ANIONGAP, EGFR, GFR No results found for: CHOL No results found for: HDL No results found for: LDLCALC No results found for: TRIG No results found for: CHOLHDL No results found for: HGBA1C    Assessment & Plan:   Problem List Items Addressed This Visit      Other   Depression with anxiety - Primary   Relevant  Medications   FLUoxetine (PROZAC) 20 MG capsule      Meds ordered this encounter  Medications  . FLUoxetine (PROZAC) 20 MG capsule    Sig: Take 1 capsule (20 mg total) by mouth daily.    Dispense:  90 capsule    Refill:  0    Follow-up: Return in about 3 months (around 03/23/2020), or stop prozac immediately with pregnancy.,.    Libby Maw, MD

## 2020-01-06 ENCOUNTER — Ambulatory Visit: Payer: 59 | Admitting: Family Medicine

## 2020-03-15 ENCOUNTER — Other Ambulatory Visit: Payer: Self-pay | Admitting: Family Medicine

## 2020-03-15 DIAGNOSIS — F418 Other specified anxiety disorders: Secondary | ICD-10-CM

## 2020-03-23 ENCOUNTER — Ambulatory Visit: Payer: 59 | Admitting: Family Medicine

## 2020-04-12 ENCOUNTER — Ambulatory Visit: Payer: 59 | Admitting: Family Medicine

## 2020-07-28 ENCOUNTER — Encounter (HOSPITAL_BASED_OUTPATIENT_CLINIC_OR_DEPARTMENT_OTHER): Payer: Self-pay | Admitting: *Deleted

## 2020-07-28 ENCOUNTER — Emergency Department (HOSPITAL_BASED_OUTPATIENT_CLINIC_OR_DEPARTMENT_OTHER)
Admission: EM | Admit: 2020-07-28 | Discharge: 2020-07-28 | Disposition: A | Discharge status: Home / Self Care | Payer: 59 | Attending: Emergency Medicine | Admitting: Emergency Medicine

## 2020-07-28 ENCOUNTER — Emergency Department (HOSPITAL_BASED_OUTPATIENT_CLINIC_OR_DEPARTMENT_OTHER): Payer: 59

## 2020-07-28 ENCOUNTER — Other Ambulatory Visit: Payer: Self-pay

## 2020-07-28 DIAGNOSIS — U071 COVID-19: Secondary | ICD-10-CM | POA: Diagnosis not present

## 2020-07-28 DIAGNOSIS — R42 Dizziness and giddiness: Secondary | ICD-10-CM | POA: Diagnosis present

## 2020-07-28 DIAGNOSIS — R55 Syncope and collapse: Secondary | ICD-10-CM | POA: Diagnosis not present

## 2020-07-28 HISTORY — DX: COVID-19: U07.1

## 2020-07-28 LAB — BASIC METABOLIC PANEL
Anion gap: 11 (ref 5–15)
BUN: 12 mg/dL (ref 6–20)
CO2: 22 mmol/L (ref 22–32)
Calcium: 8.9 mg/dL (ref 8.9–10.3)
Chloride: 103 mmol/L (ref 98–111)
Creatinine, Ser: 0.73 mg/dL (ref 0.44–1.00)
GFR, Estimated: >60 mL/min (ref >60)
Glucose, Bld: 87 mg/dL (ref 70–99)
Potassium: 3.5 mmol/L (ref 3.5–5.1)
Sodium: 136 mmol/L (ref 135–145)

## 2020-07-28 LAB — CBC WITH DIFFERENTIAL/PLATELET
Abs Immature Granulocytes: 0.03 10*3/uL (ref 0.00–0.07)
Basophils Absolute: 0 10*3/uL (ref 0.0–0.1)
Basophils Relative: 0 %
Eosinophils Absolute: 0 10*3/uL (ref 0.0–0.5)
Eosinophils Relative: 0 %
HCT: 41 % (ref 36.0–46.0)
Hemoglobin: 14.2 g/dL (ref 12.0–15.0)
Immature Granulocytes: 0 %
Lymphocytes Relative: 21 %
Lymphs Abs: 1.9 10*3/uL (ref 0.7–4.0)
MCH: 32.4 pg (ref 26.0–34.0)
MCHC: 34.6 g/dL (ref 30.0–36.0)
MCV: 93.6 fL (ref 80.0–100.0)
Monocytes Absolute: 0.6 10*3/uL (ref 0.1–1.0)
Monocytes Relative: 7 %
Neutro Abs: 6.5 10*3/uL (ref 1.7–7.7)
Neutrophils Relative %: 72 %
Platelets: 342 10*3/uL (ref 150–400)
RBC: 4.38 MIL/uL (ref 3.87–5.11)
RDW: 12.1 % (ref 11.5–15.5)
WBC: 9.1 10*3/uL (ref 4.0–10.5)
nRBC: 0 % (ref 0.0–0.2)

## 2020-07-28 LAB — HCG, SERUM, QUALITATIVE: Preg, Serum: NEGATIVE

## 2020-07-28 LAB — TROPONIN I (HIGH SENSITIVITY): Troponin I (High Sensitivity): 2 ng/L (ref <18)

## 2020-07-28 LAB — D-DIMER, QUANTITATIVE: D-Dimer, Quant: 0.33 ug/mL-FEU (ref 0.00–0.50)

## 2020-07-28 NOTE — ED Notes (Signed)
Onset today approx 1300hrs. Felt lightheaded first, felt short of breath, then began having chest pain. Placed on cont cardiac monitoring with cont POX and int NBP assessments

## 2020-07-28 NOTE — ED Triage Notes (Signed)
C/o SOB and chest pain with deep breath x 1 day , covid + 17th

## 2020-07-28 NOTE — Discharge Instructions (Signed)
Your testing is reassuring.  Keep yourself hydrated at home.  Follow-up with your doctor.  Return to the ED with new or worsening symptoms

## 2020-07-28 NOTE — ED Notes (Signed)
Completed 10 day quarantine for Covid on December 24th.

## 2020-07-28 NOTE — ED Provider Notes (Signed)
[facility] EMERGENCY DEPARTMENT Provider Note   CSN: [id number] Arrival date & time: 07/28/20  1611     History Chief Complaint  Patient presents with  . Shortness of Breath    Covid +    Claudia Davis is a 29 y.o. female.  Patient diagnosed with Covid on December 17. States she completed her to quarantine on the 24th. Today was her first day back at work at [employer]. States approximately 1 PM she felt lightheaded and dizzy. She then felt some shortness of breath and tightness in her chest that lasted for about 1-1/2 hours. She now feels improved. Feels like she is breathing better at this time. Denies cough. Does have some pain with deep breathing or lightheadedness. Admits to good p.o. intake today. No nausea or vomiting or diarrhea. No abdominal pain. No headache currently. No focal weakness, numbness or tingling. No bowel or bladder incontinence. Denies any birth control use.  The history is provided by the patient.  Shortness of Breath Associated symptoms: no abdominal pain, no fever and no vomiting        Past Medical History:  Diagnosis Date  . COVID-19   . Seasonal allergies     Patient Active Problem List   Diagnosis Date Noted  . Depression with anxiety 11/20/2019  . Stress and adjustment reaction 10/06/2019    Past Surgical History:  Procedure Laterality Date  . APPENDECTOMY  01/2019     OB History    Gravida  0   Para  0   Term  0   Preterm  0   AB  0   Living  0     SAB  0   IAB  0   Ectopic  0   Multiple  0   Live Births  0           Family History  Problem Relation Age of Onset  . Anxiety disorder Mother   . Hypertension Father     Social History   Tobacco Use  . Smoking status: Never Smoker  . Smokeless tobacco: Never Used  Vaping Use  . Vaping Use: Never used  Substance Use Topics  . Alcohol use: No  . Drug use: No    Home Medications Prior to Admission medications   Medication Sig Start  Date End Date Taking? Authorizing Provider  FLUoxetine (PROZAC) 20 MG capsule TAKE 1 CAPSULE BY MOUTH EVERY DAY 03/15/20   Mliss Sax, MD  loratadine (CLARITIN) 10 MG tablet Take 10 mg by mouth daily.    [provider]  ondansetron (ZOFRAN) 4 MG tablet Take 1 tablet (4 mg total) by mouth every 8 (eight) hours as needed. For nausea Patient not taking: Reported on 11/20/2019 09/24/19   Mliss Sax, MD  Prenatal Multivit-Min-Fe-FA (PRE-NATAL FORMULA) TABS Take one daily. Patient not taking: Reported on 12/11/2019 09/23/19   Mliss Sax, MD  tranexamic acid (LYSTEDA) 650 MG TABS tablet Take 2 tablets (1,300 mg total) by mouth 3 (three) times daily. Take during menses for a maximum of five days Patient not taking: Reported on 12/22/2019 12/11/19   Willodean Rosenthal, MD    Allergies    Amoxicillin  Review of Systems   Review of Systems  Constitutional: Positive for fatigue. Negative for activity change, appetite change and fever.  HENT: Negative for congestion and rhinorrhea.   Eyes: Negative for photophobia.  Respiratory: Positive for chest tightness and shortness of breath.   Gastrointestinal: Negative for abdominal  pain, nausea and vomiting.  Genitourinary: Negative for dysuria and hematuria.  Musculoskeletal: Negative for arthralgias and myalgias.  Neurological: Positive for weakness and light-headedness. Negative for dizziness.   all other systems are negative except as noted in the HPI and PMH.    Physical Exam Updated Vital Signs BP (!) 125/93 (BP Location: Right Arm)   Pulse 84   Temp 98.1 F (36.7 C) (Oral)   Resp (!) 22   Ht 4\' 10"  (1.473 m)   Wt 65.3 kg   SpO2 100%   BMI 30.10 kg/m   Physical Exam Vitals and nursing note reviewed.  Constitutional:      General: She is not in acute distress.    Appearance: She is well-developed and well-nourished.  HENT:     Head: Normocephalic and atraumatic.     Mouth/Throat:     Mouth:  Oropharynx is clear and moist.     Pharynx: No oropharyngeal exudate.  Eyes:     Extraocular Movements: EOM normal.     Conjunctiva/sclera: Conjunctivae normal.     Pupils: Pupils are equal, round, and reactive to light.  Neck:     Comments: No meningismus. Cardiovascular:     Rate and Rhythm: Normal rate and regular rhythm.     Pulses: Intact distal pulses.     Heart sounds: Normal heart sounds. No murmur heard.   Pulmonary:     Effort: Pulmonary effort is normal. No respiratory distress.     Breath sounds: Normal breath sounds.  Abdominal:     Palpations: Abdomen is soft.     Tenderness: There is no abdominal tenderness. There is no guarding or rebound.  Musculoskeletal:        General: No tenderness or edema. Normal range of motion.     Cervical back: Normal range of motion and neck supple.  Skin:    General: Skin is warm.  Neurological:     Mental Status: She is alert and oriented to person, place, and time.     Cranial Nerves: No cranial nerve deficit.     Motor: No abnormal muscle tone.     Coordination: Coordination normal.     Comments:  5/5 strength throughout. CN 2-12 intact.Equal grip strength.   Psychiatric:        Mood and Affect: Mood and affect normal.        Behavior: Behavior normal.     ED Results / Procedures / Treatments   Labs (all labs ordered are listed, but only abnormal results are displayed) Labs Reviewed  CBC WITH DIFFERENTIAL/PLATELET  BASIC METABOLIC PANEL  HCG, SERUM, QUALITATIVE  D-DIMER, QUANTITATIVE (NOT AT [facility])  TROPONIN I (HIGH SENSITIVITY)  TROPONIN I (HIGH SENSITIVITY)    EKG EKG Interpretation  Date/Time:  Wednesday July 28 2020 20:29:06 EST Ventricular Rate:  75 PR Interval:    QRS Duration: 83 QT Interval:  391 QTC Calculation: 437 R Axis:   80 Text Interpretation: Sinus rhythm Minimal ST depression, inferior leads Baseline wander in lead(s) V5 V6 No previous ECGs available Confirmed by 05-27-2003 [id number]) on  07/28/2020 8:39:59 PM   Radiology DG Chest Portable 1 View  Result Date: 07/28/2020 CLINICAL DATA:  COVID short of breath EXAM: PORTABLE CHEST 1 VIEW COMPARISON:  None. FINDINGS: The heart size and mediastinal contours are within normal limits. Both lungs are clear. The visualized skeletal structures are unremarkable. IMPRESSION: No active disease. Electronically Signed   By: 07/30/2020 M.D.   On: 07/28/2020 16:35  Procedures Procedures (including critical care time)  Medications Ordered in ED Medications - No data to display  ED Course  I have reviewed the triage vital signs and the nursing notes.  Pertinent labs & imaging results that were available during my care of the patient were reviewed by me and considered in my medical decision making (see chart for details).    MDM Rules/Calculators/A&P                         Recent diagnosis of Covid. She is now off quarantine. Had episode of lightheadedness with chest tightness and difficulty breathing and pain with inspiration now resolved. She is not hypoxic or tachycardic.  Chest x-ray done in triage is negative. EKG shows normal sinus rhythm, no Brugada, no prolonged QT  Labs reassuring.  Troponin and D-dimer negative.  Patient tolerating p.o. and ambulatory.  Low suspicion for ACS or pulmonary embolism.  Discussed suspected fatigue after Covid infection.  Advised p.o. hydration at home, gradual return to activities, PCP follow-up.  Return precautions discussed Final Clinical Impression(s) / ED Diagnoses Final diagnoses:  Near syncope  COVID-19    Rx / DC Orders ED Discharge Orders    None       Vesna Kable, Jeannett Senior, MD 07/28/20 2152

## 2020-07-28 NOTE — ED Notes (Signed)
AVS reviewed with pt, discussed increasing PO fluids, opportunity for questions provided. Also discussed signs and symptoms for return to ED

## 2020-07-31 NOTE — L&D Delivery Note (Signed)
OB/GYN Faculty Practice Delivery Note  Claudia Davis is a 30 y.o. G1P0000 at [redacted]w[redacted]d admitted for SROM (forebag) and active labor.   GBS Status: Negative/-- (08/25 0316) Maximum Maternal Temperature: 98.8  Labor course: Initial SVE: 1/80/-2. Augmentation with: AROM. Prolonged decel of FHR, fetal scalp electrode applied, Dr. Despina Hidden notified and Dr. Mathis Fare at bedside for assistance. She then progressed to complete. Dr. Shawnie Pons at bedside at the time delivery d/t FHR in the 90s without recovery.  ROM: 5h 56m with clear fluid  Birth: At 0820 a viable female was delivered via spontaneous vaginal delivery (Presentation: LOA). Nuchal cord present: Yes with compound RT hand entwined.  Shoulders and body delivered in usual fashion. Infant somersaulted to release from nuchal cord. Infant placed directly on mom's abdomen for bonding/skin-to-skin, baby dried and stimulated. Cord clamped x 2 after 1 minute and cut by FOB, Darren.  Cord blood collected.  The placenta separated spontaneously and delivered immediately after baby.  Pitocin infused rapidly IV per protocol.  Fundus firm with massage.  Placenta inspected and appears to be intact with no clots on the surface and a 3 VC.  Placenta/Cord with the following complications: ?abruption.  Cord pH: n/a Sponge and instrument count were correct x2.  Intrapartum complications:  high level epidural causing difficulty breathing and increased anxiety in patient and suspected Abruptio Placenta Anesthesia:  epidural Episiotomy: none Lacerations:  1st degree and labial Suture Repair: 3.0 vicryl EBL (mL): 100  Infant: APGAR (1 MIN):  7 APGAR (5 MINS):  8 Infant weight: pending  Mom to postpartum.  Baby to Couplet care / Skin to Skin. Placenta to L&D   Plans to Breastfeed Contraception: none Circumcision: wants inpatient  Note sent to [facility]: [facility] for pp visit.  Raelyn Mora , MSN, CNM 04/10/2021 8:44 AM

## 2020-08-15 ENCOUNTER — Other Ambulatory Visit: Payer: Self-pay | Admitting: Family Medicine

## 2020-08-15 DIAGNOSIS — F418 Other specified anxiety disorders: Secondary | ICD-10-CM

## 2020-08-26 ENCOUNTER — Other Ambulatory Visit: Payer: Self-pay

## 2020-08-26 ENCOUNTER — Ambulatory Visit (INDEPENDENT_AMBULATORY_CARE_PROVIDER_SITE_OTHER): Payer: 59

## 2020-08-26 ENCOUNTER — Encounter: Payer: Self-pay | Admitting: Obstetrics

## 2020-08-26 VITALS — BP 115/72 | HR 76

## 2020-08-26 DIAGNOSIS — Z3200 Encounter for pregnancy test, result unknown: Secondary | ICD-10-CM | POA: Diagnosis not present

## 2020-08-26 LAB — POCT URINE PREGNANCY: Preg Test, Ur: POSITIVE — AB

## 2020-08-26 NOTE — Progress Notes (Signed)
Patient was assessed and managed by nursing staff during this encounter. I have reviewed the chart and agree with the documentation and plan. I have also made any necessary editorial changes.  Catalina Antigua, MD 08/26/2020 3:39 PM

## 2020-08-26 NOTE — Progress Notes (Signed)
Claudia Davis presents today for UPT. She has no pregnancy complaints.  UUE:[id number] 07/05/2020    OBJECTIVE: Appears well, in no apparent distress.  OB History    Gravida  0   Para  0   Term  0   Preterm  0   AB  0   Living  0     SAB  0   IAB  0   Ectopic  0   Multiple  0   Live Births  0          Home UPT Result: positive In-Office UPT result: I have reviewed the patient's medical, obstetrical, social, and family histories, and medications.   ASSESSMENT: Positive pregnancy test  PLAN Prenatal care to be completed at:

## 2020-09-15 ENCOUNTER — Ambulatory Visit (INDEPENDENT_AMBULATORY_CARE_PROVIDER_SITE_OTHER): Payer: 59

## 2020-09-15 ENCOUNTER — Other Ambulatory Visit: Payer: Self-pay

## 2020-09-15 VITALS — BP 125/77 | HR 85 | Ht 59.0 in | Wt 146.0 lb

## 2020-09-15 DIAGNOSIS — Z3401 Encounter for supervision of normal first pregnancy, first trimester: Secondary | ICD-10-CM

## 2020-09-15 DIAGNOSIS — Z3402 Encounter for supervision of normal first pregnancy, second trimester: Secondary | ICD-10-CM | POA: Insufficient documentation

## 2020-09-15 DIAGNOSIS — O3680X0 Pregnancy with inconclusive fetal viability, not applicable or unspecified: Secondary | ICD-10-CM

## 2020-09-15 DIAGNOSIS — Z789 Other specified health status: Secondary | ICD-10-CM | POA: Diagnosis not present

## 2020-09-15 NOTE — Progress Notes (Signed)
PRENATAL INTAKE SUMMARY  Ms. Cochrane presents today New OB Nurse Interview.  OB History    Gravida  1   Para  0   Term  0   Preterm  0   AB  0   Living  0     SAB  0   IAB  0   Ectopic  0   Multiple  0   Live Births  0          I have reviewed the patient's medical, obstetrical, social, and family histories, medications, and available lab results.  SUBJECTIVE She has no unusual complaints  OBJECTIVE Initial Physical Exam (New OB)  GENERAL APPEARANCE: alert, well appearing   ASSESSMENT Normal pregnancy  PLAN Prenatal care to be completed at [place] OB labs to be completed at [facility] provider visit Baby Scripts ordered Blood pressure kit given today PHQ2 score:0 GAD 7 score:0 U/S performed today reveals single live IUP at [redacted]w[redacted]d by [place]. FHR 165

## 2020-09-15 NOTE — Progress Notes (Signed)
Agree with A & P. 

## 2020-09-23 ENCOUNTER — Other Ambulatory Visit (HOSPITAL_COMMUNITY)
Admission: RE | Admit: 2020-09-23 | Discharge: 2020-09-23 | Disposition: A | Discharge status: Home / Self Care | Payer: 59 | Source: Ambulatory Visit | Attending: Obstetrics and Gynecology | Admitting: Obstetrics and Gynecology

## 2020-09-23 ENCOUNTER — Other Ambulatory Visit: Payer: Self-pay

## 2020-09-23 ENCOUNTER — Ambulatory Visit (INDEPENDENT_AMBULATORY_CARE_PROVIDER_SITE_OTHER): Payer: 59 | Admitting: Obstetrics and Gynecology

## 2020-09-23 VITALS — BP 117/74 | HR 95 | Wt 144.0 lb

## 2020-09-23 DIAGNOSIS — Z3401 Encounter for supervision of normal first pregnancy, first trimester: Secondary | ICD-10-CM | POA: Diagnosis not present

## 2020-09-23 DIAGNOSIS — Z23 Encounter for immunization: Secondary | ICD-10-CM

## 2020-09-23 NOTE — Progress Notes (Signed)
History:   Claudia Davis is a 30 y.o. G1P0000 at [redacted]w[redacted]d by LMP being seen today for her first obstetrical visit.  Her obstetrical history is significant for healthy female . Patient does intend to breast feed. Pregnancy history fully reviewed. Unplanned pregnancy, however her and partner are excited.   Patient reports no complaints. HISTORY: OB History  Gravida Para Term Preterm AB Living  1 0 0 0 0 0  SAB IAB Ectopic Multiple Live Births  0 0 0 0 0    # Outcome Date GA Lbr Len/2nd Weight Sex Delivery Anes PTL Lv  1 Current             Last pap smear was done 2019 and was normal  Past Medical History:  Diagnosis Date  . Anxiety 08/2019  . COVID-19   . Depression 08/2019  . Seasonal allergies    Past Surgical History:  Procedure Laterality Date  . APPENDECTOMY  01/2019   Family History  Problem Relation Age of Onset  . Anxiety disorder Mother   . Hypertension Father    Social History   Tobacco Use  . Smoking status: Never Smoker  . Smokeless tobacco: Never Used  Vaping Use  . Vaping Use: Never used  Substance Use Topics  . Alcohol use: Not Currently    Comment: last drink about a year ago  . Drug use: No   Allergies  Allergen Reactions  . Amoxicillin Swelling    Throat swells    Current Outpatient Medications on File Prior to Visit  Medication Sig Dispense Refill  . Prenatal Multivit-Min-Fe-FA (PRE-NATAL FORMULA) TABS Take one daily. 30 tablet 2  . FLUoxetine (PROZAC) 20 MG capsule TAKE 1 CAPSULE BY MOUTH EVERY DAY (Patient not taking: No sig reported) 90 capsule 0  . loratadine (CLARITIN) 10 MG tablet Take 10 mg by mouth daily. (Patient not taking: Reported on 09/15/2020)    . ondansetron (ZOFRAN) 4 MG tablet Take 1 tablet (4 mg total) by mouth every 8 (eight) hours as needed. For nausea 20 tablet 0   No current facility-administered medications on file prior to visit.    Review of Systems Pertinent items noted in HPI and remainder of comprehensive  ROS otherwise negative.  Physical Exam:   Vitals:   09/23/20 1521  BP: 117/74  Pulse: 95  Weight: 144 lb (65.3 kg)   Fetal Heart Rate (bpm): 160  study. General: well-developed, well-nourished female in no acute distress  Breasts:  normal appearance, no masses or tenderness bilaterally  Skin: normal coloration and turgor, no rashes  Neurologic: oriented, normal, negative, normal mood  Extremities: normal strength, tone, and muscle mass, ROM of all joints is normal  HEENT PERRLA, extraocular movement intact and sclera clear, anicteric  Neck supple and no masses  Cardiovascular: regular rate and rhythm  Respiratory:  no respiratory distress, normal breath sounds  Abdomen: soft, non-tender; bowel sounds normal; no masses,  no organomegaly  Pelvic: normal external genitalia, no lesions, normal vaginal mucosa, normal vaginal discharge, normal cervix, pap smear done. Uterine size:      Assessment:    Pregnancy: G1P0000 Patient Active Problem List   Diagnosis Date Noted  . Encounter for supervision of normal first pregnancy in first trimester 09/15/2020  . Depression with anxiety 11/20/2019  . Stress and adjustment reaction 10/06/2019     Plan:    1. Encounter for supervision of normal first pregnancy in first trimester  - Babyscripts Schedule Optimization - CBC/D/Plt+RPR+Rh+ABO+Rub Ab... - Cytology -  PAP( [place]) - Cervicovaginal ancillary only( [place]) - Genetic Screening - Culture, OB Urine - [place] MFM OB COMP + 14 WK; Future - HgB A1c  Initial labs drawn. Continue prenatal vitamins. Problem list reviewed and updated. Genetic Screening discussed, NIPS: requested. Ultrasound discussed; fetal anatomic survey: ordered. Anticipatory guidance about prenatal visits given including labs, ultrasounds, and testing. Discussed usage of Babyscripts and virtual visits as additional source of managing and completing prenatal visits in midst of coronavirus and pandemic.    Encouraged to complete MyChart Registration for her ability to review results, send requests, and have questions addressed.  The nature of [place] [facility]Faculty Practice with multiple MDs and Advanced Practice Providers was explained to patient; also emphasized that residents, students are part of our team. Routine obstetric precautions reviewed. Encouraged to seek out care at office or emergency room [facility] preferred) for urgent and/or emergent concerns. Return in about 4 weeks (around 10/21/2020).     Mayuri Staples, Harolyn Rutherford, NP Faculty Practice [facility] for [employer], [facility]

## 2020-09-23 NOTE — Progress Notes (Signed)
Pt states she is having lower back pain, a little more on the left.

## 2020-09-23 NOTE — Addendum Note (Signed)
Addended by: Marya Landry D on: 09/23/2020 04:35 PM   Modules accepted: Orders

## 2020-09-23 NOTE — Patient Instructions (Signed)
https://www.cdc.gov/pregnancy/infections.html">  First Trimester of Pregnancy  The first trimester of pregnancy starts on the first day of your last menstrual period until the end of week 12. This is also called months 1 through 3 of pregnancy. Body changes during your first trimester Your body goes through many changes during pregnancy. The changes usually return to normal after your baby is born. Physical changes  You may gain or lose weight.  Your breasts may grow larger and hurt. The area around your nipples may get darker.  Dark spots or blotches may develop on your face.  You may have changes in your hair. Health changes  You may feel like you might vomit (nauseous), and you may vomit.  You may have heartburn.  You may have headaches.  You may have trouble pooping (constipation).  Your gums may bleed. Other changes  You may get tired easily.  You may pee (urinate) more often.  Your menstrual periods will stop.  You may not feel hungry.  You may want to eat certain kinds of food.  You may have changes in your emotions from day to day.  You may have more dreams. Follow these instructions at home: Medicines  Take over-the-counter and prescription medicines only as told by your doctor. Some medicines are not safe during pregnancy.  Take a prenatal vitamin that contains at least 600 micrograms (mcg) of folic acid. Eating and drinking  Eat healthy meals that include: ? Fresh fruits and vegetables. ? Whole grains. ? Good sources of protein, such as meat, eggs, or tofu. ? Low-fat dairy products.  Avoid raw meat and unpasteurized juice, milk, and cheese.  If you feel like you may vomit, or you vomit: ? Eat 4 or 5 small meals a day instead of 3 large meals. ? Try eating a few soda crackers. ? Drink liquids between meals instead of during meals.  You may need to take these actions to prevent or treat trouble pooping: ? Drink enough fluids to keep your pee  (urine) pale yellow. ? Eat foods that are high in fiber. These include beans, whole grains, and fresh fruits and vegetables. ? Limit foods that are high in fat and sugar. These include fried or sweet foods. Activity  Exercise only as told by your doctor. Most people can do their usual exercise routine during pregnancy.  Stop exercising if you have cramps or pain in your lower belly (abdomen) or low back.  Do not exercise if it is too hot or too humid, or if you are in a place of great height (high altitude).  Avoid heavy lifting.  If you choose to, you may have sex unless your doctor tells you not to. Relieving pain and discomfort  Wear a good support bra if your breasts are sore.  Rest with your legs raised (elevated) if you have leg cramps or low back pain.  If you have bulging veins (varicose veins) in your legs: ? Wear support hose as told by your doctor. ? Raise your feet for 15 minutes, 3-4 times a day. ? Limit salt in your food. Safety  Wear your seat belt at all times when you are in a car.  Talk with your doctor if someone is hurting you or yelling at you.  Talk with your doctor if you are feeling sad or have thoughts of hurting yourself. Lifestyle  Do not use hot tubs, steam rooms, or saunas.  Do not douche. Do not use tampons or scented sanitary pads.  Do not   use herbal medicines, illegal drugs, or medicines that are not approved by your doctor. Do not drink alcohol.  Do not smoke or use any products that contain nicotine or tobacco. If you need help quitting, ask your doctor.  Avoid cat litter boxes and soil that is used by cats. These carry germs that can cause harm to the baby and can cause a loss of your baby by miscarriage or stillbirth. General instructions  Keep all follow-up visits. This is important.  Ask for help if you need counseling or if you need help with nutrition. Your doctor can give you advice or tell you where to go for help.  Visit your  dentist. At home, brush your teeth with a soft toothbrush. Floss gently.  Write down your questions. Take them to your prenatal visits. Where to find more information  American Pregnancy Association: americanpregnancy.org  American College of Obstetricians and Gynecologists: www.acog.org  Office on Women's Health: womenshealth.gov/pregnancy Contact a doctor if:  You are dizzy.  You have a fever.  You have mild cramps or pressure in your lower belly.  You have a nagging pain in your belly area.  You continue to feel like you may vomit, you vomit, or you have watery poop (diarrhea) for 24 hours or longer.  You have a bad-smelling fluid coming from your vagina.  You have pain when you pee.  You are exposed to a disease that spreads from person to person, such as chickenpox, measles, Zika virus, HIV, or hepatitis. Get help right away if:  You have spotting or bleeding from your vagina.  You have very bad belly cramping or pain.  You have shortness of breath or chest pain.  You have any kind of injury, such as from a fall or a car crash.  You have new or increased pain, swelling, or redness in an arm or leg. Summary  The first trimester of pregnancy starts on the first day of your last menstrual period until the end of week 12 (months 1 through 3).  Eat 4 or 5 small meals a day instead of 3 large meals.  Do not smoke or use any products that contain nicotine or tobacco. If you need help quitting, ask your doctor.  Keep all follow-up visits. This information is not intended to replace advice given to you by your health care provider. Make sure you discuss any questions you have with your health care provider. Document Revised: 12/24/2019 Document Reviewed: 10/30/2019 Elsevier Patient Education  2021 Elsevier Inc.  

## 2020-09-25 LAB — HEMOGLOBIN A1C
Est. average glucose Bld gHb Est-mCnc: 94 mg/dL
Hgb A1c MFr Bld: 4.9 % (ref 4.8–5.6)

## 2020-09-25 LAB — CBC/D/PLT+RPR+RH+ABO+RUB AB...
Antibody Screen: NEGATIVE
Basophils Absolute: 0 10*3/uL (ref 0.0–0.2)
Basos: 0 %
EOS (ABSOLUTE): 0 10*3/uL (ref 0.0–0.4)
Eos: 0 %
HCV Ab: <0.1 s/co ratio (ref 0.0–0.9)
HIV Screen 4th Generation wRfx: NONREACTIVE
Hematocrit: 40 % (ref 34.0–46.6)
Hemoglobin: 13.5 g/dL (ref 11.1–15.9)
Hepatitis B Surface Ag: NEGATIVE
Immature Grans (Abs): 0 10*3/uL (ref 0.0–0.1)
Immature Granulocytes: 0 %
Lymphocytes Absolute: 1.6 10*3/uL (ref 0.7–3.1)
Lymphs: 17 %
MCH: 32.5 pg (ref 26.6–33.0)
MCHC: 33.8 g/dL (ref 31.5–35.7)
MCV: 96 fL (ref 79–97)
Monocytes Absolute: 0.6 10*3/uL (ref 0.1–0.9)
Monocytes: 7 %
Neutrophils Absolute: 7.4 10*3/uL — ABNORMAL HIGH (ref 1.4–7.0)
Neutrophils: 76 %
Platelets: 282 10*3/uL (ref 150–450)
RBC: 4.16 x10E6/uL (ref 3.77–5.28)
RDW: 11.8 % (ref 11.7–15.4)
RPR Ser Ql: NONREACTIVE
Rh Factor: POSITIVE
Rubella Antibodies, IGG: 1.75 index (ref >0.99)
WBC: 9.7 10*3/uL (ref 3.4–10.8)

## 2020-09-25 LAB — HCV INTERPRETATION

## 2020-09-27 LAB — CULTURE, OB URINE

## 2020-09-27 LAB — URINE CULTURE, OB REFLEX

## 2020-09-30 LAB — CYTOLOGY - PAP
Chlamydia: NEGATIVE
Comment: NEGATIVE
Comment: NORMAL
Diagnosis: NEGATIVE
Neisseria Gonorrhea: NEGATIVE

## 2020-10-07 ENCOUNTER — Encounter: Payer: Self-pay | Admitting: Obstetrics and Gynecology

## 2020-10-21 ENCOUNTER — Other Ambulatory Visit: Payer: Self-pay

## 2020-10-21 ENCOUNTER — Ambulatory Visit (INDEPENDENT_AMBULATORY_CARE_PROVIDER_SITE_OTHER): Payer: 59 | Admitting: Obstetrics and Gynecology

## 2020-10-21 VITALS — BP 115/79 | HR 94 | Wt 145.8 lb

## 2020-10-21 DIAGNOSIS — Z3A15 15 weeks gestation of pregnancy: Secondary | ICD-10-CM | POA: Insufficient documentation

## 2020-10-21 DIAGNOSIS — R42 Dizziness and giddiness: Secondary | ICD-10-CM | POA: Insufficient documentation

## 2020-10-21 DIAGNOSIS — Z3402 Encounter for supervision of normal first pregnancy, second trimester: Secondary | ICD-10-CM

## 2020-10-21 NOTE — Progress Notes (Signed)
   PRENATAL VISIT NOTE  Subjective:  Claudia Davis is a 29 y.o. G1P0000 at [redacted]w[redacted]d being seen today for ongoing prenatal care.  She is currently monitored for the following issues for this low-risk pregnancy and has Stress and adjustment reaction; Depression with anxiety; Encounter for supervision of normal first pregnancy in second trimester; [redacted] weeks gestation of pregnancy; and Dizziness of unknown cause on their problem list.  Patient doing well with no acute concerns today. She reports occasional dizziness.  Contractions: Irritability. Vag. Bleeding: None.   Denies leaking of fluid.    Discussed dizziness with patient and spouse.  Explained many times there is no etiology for the symptom; however if this because more often then pt may need cardiology referral for further eval.   The following portions of the patient's history were reviewed and updated as appropriate: allergies, current medications, past family history, past medical history, past social history, past surgical history and problem list. Problem list updated.  Objective:   Vitals:   10/21/20 1446  BP: 115/79  Pulse: 94  Weight: 145 lb 12.8 oz (66.1 kg)    Fetal Status: Fetal Heart Rate (bpm): 150         General:  Alert, oriented and cooperative. Patient is in no acute distress.  Skin: Skin is warm and dry. No rash noted.   Cardiovascular: Normal heart rate noted  Respiratory: Normal respiratory effort, no problems with respiration noted  Abdomen: Soft,  gravid, appropriate for gestational age.  Pain/Pressure: Absent     Pelvic: Cervical exam deferred        Extremities: Normal range of motion.  Edema: None  Mental Status:  Normal mood and affect. Normal behavior. Normal judgment and thought content.   Assessment and Plan:  Pregnancy: G1P0000 at [redacted]w[redacted]d  1. Encounter for supervision of normal first pregnancy in second trimester  - AFP, Serum, Open Spina Bifida  2. [redacted] weeks gestation of pregnancy   3.  Dizziness of unknown cause Increase hydration, several small meals throughout the day, longer intervals between position changes. If symptoms continue , consider cardiology referral  Preterm labor symptoms and general obstetric precautions including but not limited to vaginal bleeding, contractions, leaking of fluid and fetal movement were reviewed in detail with the patient.  Please refer to After Visit Summary for other counseling recommendations.   Return in about 4 weeks (around 11/18/2020) for ROB, in person.   Mariel Aloe, MD Faculty Attending [facility]

## 2020-10-21 NOTE — Progress Notes (Signed)
Patient presents for ROB. Patient complains of having dizzy spells. She states that bp has been normal at home. She states that she is drinking about 3 40 oz bottle of water per day.

## 2020-10-23 LAB — AFP, SERUM, OPEN SPINA BIFIDA
AFP MoM: 0.77
AFP Value: 24.3 ng/mL
Gest. Age on Collection Date: 15.3 weeks
Maternal Age At EDD: 29.6 yr
OSBR Risk 1 IN: 10000
Test Results:: NEGATIVE
Weight: 145 [lb_av]

## 2020-11-15 ENCOUNTER — Ambulatory Visit: Payer: 59 | Attending: Obstetrics and Gynecology

## 2020-11-15 ENCOUNTER — Ambulatory Visit: Payer: 59 | Admitting: *Deleted

## 2020-11-15 ENCOUNTER — Other Ambulatory Visit: Payer: Self-pay | Admitting: Obstetrics and Gynecology

## 2020-11-15 ENCOUNTER — Other Ambulatory Visit: Payer: Self-pay

## 2020-11-15 DIAGNOSIS — Z3A18 18 weeks gestation of pregnancy: Secondary | ICD-10-CM

## 2020-11-15 DIAGNOSIS — O358XX0 Maternal care for other (suspected) fetal abnormality and damage, not applicable or unspecified: Secondary | ICD-10-CM | POA: Diagnosis not present

## 2020-11-15 DIAGNOSIS — Z3401 Encounter for supervision of normal first pregnancy, first trimester: Secondary | ICD-10-CM | POA: Diagnosis present

## 2020-11-15 DIAGNOSIS — Q632 Ectopic kidney: Secondary | ICD-10-CM | POA: Insufficient documentation

## 2020-11-15 DIAGNOSIS — Z3402 Encounter for supervision of normal first pregnancy, second trimester: Secondary | ICD-10-CM | POA: Insufficient documentation

## 2020-11-15 NOTE — Progress Notes (Unsigned)
MFM Brief Consultation  Claudia Davis is G1P0 here for a detailed anatomy she is dated by 9w 4d with an EDD of 04/16/21, as she had an approximate LMP.   She is doing well today without complaints. She has neg AFP.  Her medical history is uncomplicated.  Today we observed normal anatomy with measurements consistent with dates, with exception for a pelvic kidney without hydroureter. There is good fetal movement and amniotic fluid..  I discussed today that we had suboptimal views of the fetal anatomy secondary to fetal position.   Secondly, I discussed the left pelvic kidney, also called simple renal ectopia and refers to a kidney that is on the correct side but has failed to migrate upward. A pelvic kidney is located opposite the sacrum and below the aortic bifurcation. Pelvic kidney occurs in 1:?2000 to 1:3000 births. It is slightly more common in males, and a left-sided predominance has been reported. Pelvic kidney is bilateral in 12% of cases. During the 6th through 9th gestational weeks, the kidneys ascend from the pelvis to the lumbar region just below the adrenal glands. Failure of ascent results in renal ectopia. The mechanism behind failed ascent is unknown. Theories include ureteral bud maldevelopment, defective metanephric tissue, genetic abnormalities, vascular obstruction, and teratogen exposure.Pelvic kidney results from complete failure of ascent. Pelvic kidneys are usually located below the level of the aortic bifurcation and opposite the sacrum, which is where we observed the left kidney today. Often there can be associated renal pyelectasis or hydroureter, we did on observe either of these findings today.  At the conclusion of our visit I scheduled Claudia Davis to return in 6 weeks given the increased risk for renal hydronephrosis and possible hydroureter.  All questions were answered  I spent 20 minutes with > 50% in face to face consultation.  Novella Olive, MD

## 2020-11-16 ENCOUNTER — Other Ambulatory Visit: Payer: Self-pay | Admitting: *Deleted

## 2020-11-16 DIAGNOSIS — Q632 Ectopic kidney: Secondary | ICD-10-CM

## 2020-11-17 ENCOUNTER — Other Ambulatory Visit: Payer: Self-pay

## 2020-11-17 ENCOUNTER — Ambulatory Visit (INDEPENDENT_AMBULATORY_CARE_PROVIDER_SITE_OTHER): Payer: 59 | Admitting: Obstetrics & Gynecology

## 2020-11-17 ENCOUNTER — Encounter: Payer: Self-pay | Admitting: Obstetrics & Gynecology

## 2020-11-17 VITALS — BP 122/81 | HR 122 | Temp 97.6°F | Wt 145.0 lb

## 2020-11-17 DIAGNOSIS — Z3A18 18 weeks gestation of pregnancy: Secondary | ICD-10-CM

## 2020-11-17 DIAGNOSIS — Z3402 Encounter for supervision of normal first pregnancy, second trimester: Secondary | ICD-10-CM

## 2020-11-17 DIAGNOSIS — R55 Syncope and collapse: Secondary | ICD-10-CM

## 2020-11-17 DIAGNOSIS — R42 Dizziness and giddiness: Secondary | ICD-10-CM

## 2020-11-17 DIAGNOSIS — R000 Tachycardia, unspecified: Secondary | ICD-10-CM

## 2020-11-17 NOTE — Progress Notes (Signed)
PRENATAL VISIT NOTE  Subjective:  Claudia Davis is a 30 y.o. G1P0000 at [redacted]w[redacted]d being seen today for ongoing prenatal care.  She is currently monitored for the following issues for this low-risk pregnancy and has Stress and adjustment reaction; Depression with anxiety; Encounter for supervision of normal first pregnancy in second trimester; and Dizziness of unknown cause on their problem list.  Patient reports continued dizziness and syncopal episodes, especially at work. Never at home. Also occurred once at church.  Denies any palpitations, CP, SOB but the episodes are getting more frequent and concerning.  Contractions: Not present. Vag. Bleeding: None.   . Denies leaking of fluid.   The following portions of the patient's history were reviewed and updated as appropriate: allergies, current medications, past family history, past medical history, past social history, past surgical history and problem list.   Objective:   Vitals:   11/17/20 1558  BP: 122/81  Pulse: (!) 122  Temp: 97.6 F (36.4 C)  Weight: 145 lb (65.8 kg)    Fetal Status: Fetal Heart Rate (bpm): 150         General:  Alert, oriented and cooperative. Patient is in no acute distress.  Skin: Skin is warm and dry. No rash noted.   Cardiovascular: Normal heart rate noted  Respiratory: Normal respiratory effort, no problems with respiration noted  Abdomen: Soft, gravid, appropriate for gestational age.  Pain/Pressure: Absent     Pelvic: Cervical exam deferred        Extremities: Normal range of motion.  Edema: None  Mental Status: Normal mood and affect. Normal behavior. Normal judgment and thought content.    Imaging: [place] MFM OB DETAIL +14 WK  Result Date: 11/15/2020 ----------------------------------------------------------------------  OBSTETRICS REPORT                       (Signed Final 11/15/2020 05:49 pm) ---------------------------------------------------------------------- Patient Info  ID #:        [id number]                          D.O.B.:  04-09-1991 (30 yrs)  Name:       Claudia Davis               Visit Date: 11/15/2020 02:42 pm ---------------------------------------------------------------------- Performed By  Attending:        Lin Landsman      Ref. Address:     [address]                    MD                                                             [address]                                                             [place], [place]  [address]  Performed By:     Percell Boston          [facility]  Referred By:      Hermina Staggers                    MD ---------------------------------------------------------------------- Orders  #  Description                           Code        Ordered By  1  [place] MFM OB DETAIL +14 WK               [id number]    JENNIFER [facility] ----------------------------------------------------------------------  #  Order #                     Accession #                Episode #  1  [id number]                   [id number]                 [id number] ---------------------------------------------------------------------- Indications  Fetal abnormality - other known or             O35.9XX0  suspected (LT pelvic kidney)  [redacted] weeks gestation of pregnancy                Z3A.18  Encounter for antenatal screening for          Z36.3  malformations  AFP neg, neg [facility] ---------------------------------------------------------------------- Fetal Evaluation  Num Of Fetuses:         1  Fetal Heart Rate(bpm):  144  Cardiac Activity:       Observed  Presentation:           Transverse, head to maternal left  Placenta:               Fundal  P. Cord Insertion:      Visualized, central  Amniotic Fluid   AFI FV:      Within normal limits                              Largest Pocket(cm)  4.08 ---------------------------------------------------------------------- Biometry  BPD:      44.4  mm     G. Age:  19w 3d         91  %    CI:        78.51   %    70 - 86                                                          FL/HC:      15.8   %    15.8 - 18  HC:      158.5  mm     G. Age:  18w 5d         64  %    HC/AC:      1.16        1.07 - 1.29  AC:      136.9  mm     G. Age:  19w 1d         75  %    FL/BPD:     56.3   %  FL:         25  mm     G. Age:  17w 4d         19  %    FL/AC:      18.3   %    20 - 24  HUM:        23  mm     G. Age:  17w 1d         18  %  CER:      20.5  mm     G. Age:  19w 5d         97  %  NFT:       3.8  mm  CM:        3.9  mm  Est. FW:     241  gm      0 lb 9 oz     56  % ---------------------------------------------------------------------- OB History  Gravidity:    1         Term:   0        Prem:   0        SAB:   0  TOP:          0       Ectopic:  0        Living: 0 ---------------------------------------------------------------------- Gestational Age  LMP:           17w 4d        Date:  07/15/20                 EDD:   04/21/21  U/S Today:     18w 5d                                        EDD:   04/13/21  Best:          18w 2d     Det. By:  U/S C R L  (09/15/20)    EDD:   04/16/21 ---------------------------------------------------------------------- Anatomy  Cranium:  Appears normal         LVOT:                   Not well visualized  Cavum:                 Appears normal         Aortic Arch:            Appears normal  Ventricles:            Appears normal         Ductal Arch:            Appears normal  Choroid Plexus:        Appears normal         Diaphragm:              Appears normal  Cerebellum:            Appears normal         Stomach:                Appears normal, left                                                                         sided  Posterior Fossa:       Appears normal         Abdomen:                Appears normal  Nuchal Fold:           Appears normal         Abdominal Wall:         Appears nml (cord                                                                        insert, abd wall)  Face:                  Appears normal         Cord Vessels:           Appears normal (3                         (orbits and profile)                           vessel cord)  Lips:                  Appears normal         Kidneys:                Pelvic kidney (LT)  Palate:                Not well visualized    Bladder:                Appears normal  Thoracic:              Appears normal         Spine:                  Appears normal  Heart:                 Appears normal         Upper Extremities:      Appears normal                         (4CH, axis, and                         situs)  RVOT:                  Appears normal         Lower Extremities:      Appears normal  Other:  Fetus appears to be a female. Heels visualized. Nasal bone visualized.          Technically difficult due to fetal position. ---------------------------------------------------------------------- Cervix Uterus Adnexa  Cervix  Length:            3.4  cm.  Closed  Uterus  No abnormality visualized.  Right Ovary  No adnexal mass visualized.  Left Ovary  No adnexal mass visualized.  Cul De Sac  No free fluid seen.  Adnexa  No abnormality visualized. ---------------------------------------------------------------------- Comments  Ms. Sandre KittyRaines is G1P0 here for a detailed anatomy she is dated  by 9w 4d with an EDD of 04/16/21, as she had an  approximate LMP.  She is doing well today without complaints. She has neg AFP.  Her medical history is uncomplicated.  Today we observed normal anatomy with measurements  consistent with dates, with exception for a pelvic kidney  without hydroureter.  There is good fetal movement and amniotic fluid..  I discussed today that we had suboptimal views of  the fetal  anatomy secondary to fetal position.  Secondly, I discussed the left pelvic kidney, also called  simple renal ectopia and refers to a kidney that is on the  correct side but has failed to migrate upward. A pelvic kidney  is located opposite the sacrum and below the aortic  bifurcation. Pelvic kidney occurs in 1:?2000 to 1:3000 births.  It is slightly more common in males, and a left-sided  predominance has been reported. Pelvic kidney is bilateral in  12% of cases. During the 6th through 9th gestational weeks,  the kidneys ascend from the pelvis to the lumbar region just  below the adrenal glands. Failure of ascent results in renal  ectopia. The mechanism behind failed ascent is unknown.  Theories include ureteral bud maldevelopment, defective  metanephric tissue, genetic abnormalities, vascular  obstruction, and teratogen exposure.Pelvic kidney results  from complete failure of ascent. Pelvic kidneys are usually  located below the level of the aortic bifurcation and opposite  the sacrum, which is where we observed the left kidney  today. Often there can be associated renal pyelectasis or  hydroureter, we did on observe either of these findings today.  At the conclusion of our visit I scheduled Ms. Sobczyk to return  in 6 weeks given the increased risk for renal hydronephrosis  and possible hydroureter.  All questions were answered  I spent 20 minutes with > 50% in face to face consultation. ----------------------------------------------------------------------  Lin Landsman, MD Electronically Signed Final Report   11/15/2020 05:49 pm ----------------------------------------------------------------------   Assessment and Plan:  Pregnancy: G1P0000 at [redacted]w[redacted]d 1. Syncope, unspecified syncope type 2. Dizziness of unknown cause 3. Tachycardia Unsure etiology. Already adequately hydrating and eating frequent meals.  Will check labs, refer to Cardiology. Tachycardic today, patient reports  this is unusual for her. Will continue to monitor. - Ambulatory referral to Cardiology - Comprehensive metabolic panel - TSH  4. [redacted] weeks gestation of pregnancy 5. Encounter for supervision of normal first pregnancy in second trimester Low risk NIPS. Negative AFP.  Anatomy scan remarkable for fetal pelvic kidney as above, follow up scan ordered. No other complaints or concerns.  Routine obstetric precautions reviewed.  Please refer to After Visit Summary for other counseling recommendations.   Return in about 4 weeks (around 12/15/2020) for OFFICE OB VISIT (MD only).  Future Appointments  Date Time Provider Department Center  12/20/2020  2:30 PM [facility] NURSE [facility]  12/20/2020  2:45 PM [facility] US[facility]    Jaynie Collins, MD

## 2020-11-17 NOTE — Patient Instructions (Signed)

## 2020-11-17 NOTE — Progress Notes (Signed)
ROB c/o fainting, dizziness x 4 weeks

## 2020-11-18 LAB — COMPREHENSIVE METABOLIC PANEL
ALT: 13 IU/L (ref 0–32)
AST: 12 IU/L (ref 0–40)
Albumin/Globulin Ratio: 1.6 (ref 1.2–2.2)
Albumin: 3.9 g/dL (ref 3.9–5.0)
Alkaline Phosphatase: 57 IU/L (ref 44–121)
BUN/Creatinine Ratio: 9 (ref 9–23)
BUN: 5 mg/dL — ABNORMAL LOW (ref 6–20)
Bilirubin Total: <0.2 mg/dL (ref 0.0–1.2)
CO2: 21 mmol/L (ref 20–29)
Calcium: 8.9 mg/dL (ref 8.7–10.2)
Chloride: 103 mmol/L (ref 96–106)
Creatinine, Ser: 0.58 mg/dL (ref 0.57–1.00)
Globulin, Total: 2.5 g/dL (ref 1.5–4.5)
Glucose: 84 mg/dL (ref 65–99)
Potassium: 3.9 mmol/L (ref 3.5–5.2)
Sodium: 138 mmol/L (ref 134–144)
Total Protein: 6.4 g/dL (ref 6.0–8.5)
eGFR: 126 mL/min/{1.73_m2} (ref >59)

## 2020-11-18 LAB — TSH: TSH: 2.19 u[IU]/mL (ref 0.450–4.500)

## 2020-11-20 ENCOUNTER — Encounter: Payer: Self-pay | Admitting: Obstetrics and Gynecology

## 2020-11-20 DIAGNOSIS — Q632 Ectopic kidney: Secondary | ICD-10-CM | POA: Insufficient documentation

## 2020-11-29 DIAGNOSIS — U071 COVID-19: Secondary | ICD-10-CM | POA: Insufficient documentation

## 2020-11-29 DIAGNOSIS — J302 Other seasonal allergic rhinitis: Secondary | ICD-10-CM | POA: Insufficient documentation

## 2020-12-08 NOTE — Progress Notes (Signed)
Cardiology Office Note:    Date:  12/09/2020   ID:  Claudia Davis, DOB Jan 30, 1991, MRN [id number]  PCP:  Mliss Sax, MD  Cardiologist:  Norman Herrlich, MD   Referring MD: Tereso Newcomer, MD  ASSESSMENT:    1. Palpitation   2. Lightheadedness    PLAN:    In order of problems listed above:  1. Her symptoms have spontaneously improved predominantly orthostatic lightheadedness precipitated by prolonged upright posture also 1 episode with a heart felt rapid.  Further evaluation 7-day ZIO monitor and echocardiogram.  She has modified her work: She is doing better I asked her to be sure that when she is standing at work she pumps her calves to avoid pulling and lower extremity.  Next appointment 4 to 6 weeks with Dr.Tobb or cardio obstetrics specialist   Medication Adjustments/Labs and Tests Ordered: Current medicines are reviewed at length with the patient today.  Concerns regarding medicines are outlined above.  Orders Placed This Encounter  Procedures  . LONG TERM MONITOR (3-14 DAYS)  . EKG 12-Lead  . ECHOCARDIOGRAM COMPLETE   No orders of the defined types were placed in this encounter.    No chief complaint on file.   History of Present Illness:    Claudia Davis is a 30 y.o. female who is being seen today for the evaluation of dizziness and syncope at the request of Anyanwu, Jethro Bastos, MD.  She was referred by the obstetrics team at 18 weeks of gestation.  Her office visit showed a resting heart rate of 122 bpm.  She was seen med [facility] ED 07/28/2020.  She had recent diagnosis of COVID prior to the visit off quarantine episode of lightheadedness while at work.  Chest x-ray in the ER was normal EKG showed sinus rhythm with no evidence of channel disorder normal QT interval.  Laboratory studies including troponin and D-dimer were felt to be normal and she was discharged from the hospital.  Her EKG showed sinus rhythm minor nonspecific ST  abnormality.  She is presently [redacted] weeks pregnant first pregnancy She started having episodes in March where she would feel lightheaded with prolonged upright posture and a few of the more severe and 1 was associated with a sensation of her heart being rapid. She did not lose consciousness She changed her work overall and hours and has not had an episode in the last 3 to 4 weeks. No chest pain shortness of breath or syncope. She has no known history of heart disease She has had no complications of pregnancy. Past Medical History:  Diagnosis Date  . Anxiety 08/2019  . COVID-19   . Depression 08/2019  . Seasonal allergies     Past Surgical History:  Procedure Laterality Date  . APPENDECTOMY  01/2019    Current Medications: Current Meds  Medication Sig  . loratadine (CLARITIN) 10 MG tablet Take 10 mg by mouth daily.  . Prenatal Multivit-Min-Fe-FA (PRE-NATAL FORMULA) TABS Take one daily.     Allergies:   Amoxicillin   Social History   Socioeconomic History  . Marital status: Married    Spouse name: Not on file  . Number of children: Not on file  . Years of education: Not on file  . Highest education level: Not on file  Occupational History  . Not on file  Tobacco Use  . Smoking status: Never Smoker  . Smokeless tobacco: Never Used  Vaping Use  . Vaping Use: Never used  Substance  and Sexual Activity  . Alcohol use: Not Currently    Comment: last drink about a year ago  . Drug use: No  . Sexual activity: Yes    Partners: Male    Birth control/protection: None  Other Topics Concern  . Not on file  Social History Narrative   ** Merged History Encounter **       Social Determinants of Health   Financial Resource Strain: Not on file  Food Insecurity: Not on file  Transportation Needs: Not on file  Physical Activity: Not on file  Stress: Not on file  Social Connections: Not on file     Family History: The patient's family history includes Anxiety disorder in  her mother; Hypertension in her father.  ROS:   ROS Please see the history of present illness.     All other systems reviewed and are negative.  EKGs/Labs/Other Studies Reviewed:    The following studies were reviewed today:   EKG:  EKG is  ordered today.  The ekg ordered today is personally reviewed and demonstrates sinus tachycardia 109 bpm PR interval is borderline short no findings of preexcitation, the EKG is otherwise normal  Recent Labs: 09/23/2020: Hemoglobin 13.5; Platelets 282 11/17/2020: ALT 13; BUN 5; Creatinine, Ser 0.58; Potassium 3.9; Sodium 138; TSH 2.190  Recent Lipid Panel No results found for: CHOL, TRIG, HDL, CHOLHDL, VLDL, LDLCALC, LDLDIRECT  Physical Exam:    VS:  BP 124/60 (BP Location: Right Arm, Patient Position: Sitting, Cuff Size: Normal)   Pulse (!) 118   Ht 4\' 11"  (1.499 m)   Wt 148 lb 1.9 oz (67.2 kg)   LMP 07/05/2020   SpO2 100%   BMI 29.92 kg/m     Wt Readings from Last 3 Encounters:  12/09/20 148 lb 1.9 oz (67.2 kg)  11/17/20 145 lb (65.8 kg)  10/21/20 145 lb 12.8 oz (66.1 kg)     GEN: She looks healthy well nourished, well developed in no acute distress HEENT: Normal NECK: No JVD; No carotid bruits LYMPHATICS: No lymphadenopathy CARDIAC: RRR, no murmurs, rubs, gallops RESPIRATORY:  Clear to auscultation without rales, wheezing or rhonchi  ABDOMEN: Soft, non-tender, non-distended MUSCULOSKELETAL:  No edema; No deformity  SKIN: Warm and dry NEUROLOGIC:  Alert and oriented x 3 PSYCHIATRIC:  Normal affect     Signed, 10/23/20, MD  12/09/2020 4:10 PM    [facility]

## 2020-12-09 ENCOUNTER — Ambulatory Visit (INDEPENDENT_AMBULATORY_CARE_PROVIDER_SITE_OTHER): Payer: 59

## 2020-12-09 ENCOUNTER — Other Ambulatory Visit: Payer: Self-pay

## 2020-12-09 ENCOUNTER — Ambulatory Visit (INDEPENDENT_AMBULATORY_CARE_PROVIDER_SITE_OTHER): Payer: 59 | Admitting: Cardiology

## 2020-12-09 ENCOUNTER — Encounter: Payer: Self-pay | Admitting: Cardiology

## 2020-12-09 VITALS — BP 124/60 | HR 118 | Ht 59.0 in | Wt 148.1 lb

## 2020-12-09 DIAGNOSIS — R42 Dizziness and giddiness: Secondary | ICD-10-CM | POA: Diagnosis not present

## 2020-12-09 DIAGNOSIS — R002 Palpitations: Secondary | ICD-10-CM | POA: Diagnosis not present

## 2020-12-09 NOTE — Patient Instructions (Addendum)
Medication Instructions:  Your physician recommends that you continue on your current medications as directed. Please refer to the Current Medication list given to you today.  *If you need a refill on your cardiac medications before your next appointment, please call your pharmacy*   Lab Work: None If you have labs (blood work) drawn today and your tests are completely normal, you will receive your results only by: Marland Kitchen MyChart Message (if you have MyChart) OR . A paper copy in the mail If you have any lab test that is abnormal or we need to change your treatment, we will call you to review the results.   Testing/Procedures: Your physician has requested that you have an echocardiogram. Echocardiography is a painless test that uses sound waves to create images of your heart. It provides your doctor with information about the size and shape of your heart and how well your heart's chambers and valves are working. This procedure takes approximately one hour. There are no restrictions for this procedure.  A zio monitor was ordered today. It will remain on for 7 days. You will then return monitor and event diary in provided box. It takes 1-2 weeks for report to be downloaded and returned to [place]. We will call you with the results. If monitor falls off or has orange flashing light, please call Zio for further instructions.      Follow-Up: At [facility], you and your health needs are our priority.  As part of our continuing mission to provide you with exceptional heart care, we have created designated Provider Care Teams.  These Care Teams include your primary Cardiologist (physician) and Advanced Practice Providers (APPs -  Physician Assistants and Nurse Practitioners) who all work together to provide you with the care you need, when you need it.  We recommend signing up for the patient portal called "MyChart".  Sign up information is provided on this After Visit Summary.  MyChart is used to connect  with patients for Virtual Visits (Telemedicine).  Patients are able to view lab/test results, encounter notes, upcoming appointments, etc.  Non-urgent messages can be sent to your provider as well.   To learn more about what you can do with MyChart, go to [email or web address].    Your next appointment:   6 week(s)  The format for your next appointment:   In Person  Provider:   Thomasene Ripple, MD   Other Instructions

## 2020-12-16 ENCOUNTER — Encounter: Payer: Self-pay | Admitting: Obstetrics and Gynecology

## 2020-12-16 ENCOUNTER — Other Ambulatory Visit: Payer: Self-pay

## 2020-12-16 ENCOUNTER — Ambulatory Visit (INDEPENDENT_AMBULATORY_CARE_PROVIDER_SITE_OTHER): Payer: 59 | Admitting: Obstetrics and Gynecology

## 2020-12-16 VITALS — BP 126/83 | HR 109 | Wt 148.0 lb

## 2020-12-16 DIAGNOSIS — Z3402 Encounter for supervision of normal first pregnancy, second trimester: Secondary | ICD-10-CM

## 2020-12-16 DIAGNOSIS — Q632 Ectopic kidney: Secondary | ICD-10-CM

## 2020-12-16 DIAGNOSIS — R42 Dizziness and giddiness: Secondary | ICD-10-CM

## 2020-12-16 NOTE — Patient Instructions (Signed)

## 2020-12-16 NOTE — Progress Notes (Signed)
Subjective:  Claudia Davis is a 30 y.o. G1P0000 at [redacted]w[redacted]d being seen today for ongoing prenatal care.  She is currently monitored for the following issues for this high-risk pregnancy and has Stress and adjustment reaction; Depression with anxiety; Encounter for supervision of normal first pregnancy in second trimester; Dizziness of unknown cause; Pelvic kidney; Seasonal allergies; Depression; and Anxiety on their problem list.  Patient reports no complaints.  Contractions: Not present. Vag. Bleeding: None.  Movement: Present. Denies leaking of fluid.   The following portions of the patient's history were reviewed and updated as appropriate: allergies, current medications, past family history, past medical history, past social history, past surgical history and problem list. Problem list updated.  Objective:   Vitals:   12/16/20 1445  BP: 126/83  Pulse: (!) 109  Weight: 148 lb (67.1 kg)    Fetal Status: Fetal Heart Rate (bpm): 158   Movement: Present     General:  Alert, oriented and cooperative. Patient is in no acute distress.  Skin: Skin is warm and dry. No rash noted.   Cardiovascular: Normal heart rate noted  Respiratory: Normal respiratory effort, no problems with respiration noted  Abdomen: Soft, gravid, appropriate for gestational age. Pain/Pressure: Absent     Pelvic:  Cervical exam deferred        Extremities: Normal range of motion.  Edema: None  Mental Status: Normal mood and affect. Normal behavior. Normal judgment and thought content.   Urinalysis:      Assessment and Plan:  Pregnancy: G1P0000 at [redacted]w[redacted]d  1. Encounter for supervision of normal first pregnancy in second trimester Stable Glucola next visit  2. Pelvic kidney F/U scan as per MFM  3. Dizziness of unknown cause Being evaluated by Cardiology  Preterm labor symptoms and general obstetric precautions including but not limited to vaginal bleeding, contractions, leaking of fluid and fetal movement were  reviewed in detail with the patient. Please refer to After Visit Summary for other counseling recommendations.  Return in about 4 weeks (around 01/13/2021) for OB visit, face to face, any provider, fasting for Gluocla.   Hermina Staggers, MD

## 2020-12-20 ENCOUNTER — Encounter: Payer: Self-pay | Admitting: *Deleted

## 2020-12-20 ENCOUNTER — Ambulatory Visit: Payer: 59 | Admitting: *Deleted

## 2020-12-20 ENCOUNTER — Other Ambulatory Visit: Payer: Self-pay

## 2020-12-20 ENCOUNTER — Ambulatory Visit: Payer: 59 | Attending: Maternal & Fetal Medicine

## 2020-12-20 DIAGNOSIS — Z3402 Encounter for supervision of normal first pregnancy, second trimester: Secondary | ICD-10-CM | POA: Diagnosis present

## 2020-12-20 DIAGNOSIS — Z3A23 23 weeks gestation of pregnancy: Secondary | ICD-10-CM

## 2020-12-20 DIAGNOSIS — O359XX0 Maternal care for (suspected) fetal abnormality and damage, unspecified, not applicable or unspecified: Secondary | ICD-10-CM | POA: Diagnosis not present

## 2020-12-20 DIAGNOSIS — Z363 Encounter for antenatal screening for malformations: Secondary | ICD-10-CM | POA: Diagnosis not present

## 2020-12-20 DIAGNOSIS — Q632 Ectopic kidney: Secondary | ICD-10-CM | POA: Insufficient documentation

## 2020-12-21 ENCOUNTER — Other Ambulatory Visit: Payer: Self-pay | Admitting: Obstetrics

## 2020-12-21 DIAGNOSIS — O359XX0 Maternal care for (suspected) fetal abnormality and damage, unspecified, not applicable or unspecified: Secondary | ICD-10-CM

## 2020-12-22 ENCOUNTER — Ambulatory Visit (HOSPITAL_BASED_OUTPATIENT_CLINIC_OR_DEPARTMENT_OTHER): Admission: RE | Admit: 2020-12-22 | Payer: 59 | Source: Ambulatory Visit

## 2020-12-24 ENCOUNTER — Telehealth: Payer: Self-pay

## 2020-12-24 ENCOUNTER — Telehealth: Payer: Self-pay | Admitting: Cardiology

## 2020-12-24 NOTE — Telephone Encounter (Signed)
Nurse took the call  

## 2020-12-24 NOTE — Telephone Encounter (Signed)
Spoke with patient regarding results and recommendation.  Patient verbalizes understanding and is agreeable to plan of care. Advised patient to call back with any issues or concerns.  

## 2020-12-24 NOTE — Telephone Encounter (Signed)
-----   Message from Brian J Munley, MD sent at 12/23/2020  4:14 PM EDT ----- Is a very good result normal and the episode where she is aware of her heart was in normal heart rhythm.   

## 2020-12-24 NOTE — Telephone Encounter (Signed)
Left message on patients voicemail to please return our call.   

## 2020-12-24 NOTE — Telephone Encounter (Signed)
-----   Message from Baldo Daub, MD sent at 12/23/2020  4:14 PM EDT ----- Is a very good result normal and the episode where she is aware of her heart was in normal heart rhythm.

## 2021-01-04 ENCOUNTER — Other Ambulatory Visit: Payer: Self-pay

## 2021-01-04 ENCOUNTER — Ambulatory Visit (INDEPENDENT_AMBULATORY_CARE_PROVIDER_SITE_OTHER): Payer: 59 | Admitting: Obstetrics & Gynecology

## 2021-01-04 VITALS — BP 123/86 | HR 102 | Wt 149.8 lb

## 2021-01-04 DIAGNOSIS — Z3402 Encounter for supervision of normal first pregnancy, second trimester: Secondary | ICD-10-CM

## 2021-01-04 DIAGNOSIS — Z3A25 25 weeks gestation of pregnancy: Secondary | ICD-10-CM

## 2021-01-04 DIAGNOSIS — M545 Low back pain, unspecified: Secondary | ICD-10-CM

## 2021-01-04 DIAGNOSIS — R000 Tachycardia, unspecified: Secondary | ICD-10-CM

## 2021-01-04 MED ORDER — PROMETHAZINE HCL 25 MG PO TABS: 25.0000 mg | ORAL_TABLET | Freq: Four times a day (QID) | ORAL | 1 refills | Status: DC | PRN

## 2021-01-04 NOTE — Progress Notes (Signed)
   PRENATAL VISIT NOTE  Subjective:  Claudia Davis is a 30 y.o. G1P0000 at [redacted]w[redacted]d being seen today for ongoing prenatal care.  She is currently monitored for the following issues for this high-risk pregnancy and has Stress and adjustment reaction; Depression with anxiety; Encounter for supervision of normal first pregnancy in second trimester; Dizziness of unknown cause; Pelvic kidney; Seasonal allergies; Depression; and Anxiety on their problem list.  Patient reports low back pain, some pelvic pressure and recent pain with intercourse.  Concerned about preterm labor..  Contractions: Irritability. Vag. Bleeding: None.  Movement: Present. Denies leaking of fluid.   The following portions of the patient's history were reviewed and updated as appropriate: allergies, current medications, past family history, past medical history, past social history, past surgical history and problem list.   Objective:   Vitals:   01/04/21 1358  BP: 123/86  Pulse: (!) 102  Weight: 149 lb 12.8 oz (67.9 kg)    Fetal Status: Fetal Heart Rate (bpm): 143 Fundal Height: 28 cm Movement: Present     General:  Alert, oriented and cooperative. Patient is in no acute distress.  Skin: Skin is warm and dry. No rash noted.   Cardiovascular: Normal heart rate noted  Respiratory: Normal respiratory effort, no problems with respiration noted  Abdomen: Soft, gravid, appropriate for gestational age.  Pain/Pressure: Absent     Pelvic: Cervical exam performed in the presence of a chaperone Dilation: Closed Effacement (%): 0  closed, thick  Extremities: Normal range of motion.  Edema: Trace  Mental Status: Normal mood and affect. Normal behavior. Normal judgment and thought content.   Assessment and Plan:  Pregnancy: G1P0000 at [redacted]w[redacted]d 1. [redacted] weeks gestation of pregnancy - on PNV  2. Encounter for supervision of normal first pregnancy in second trimester - has appt 6/16 for 2hr GTT/CBC/RPR - Tdap discussed.  Pt ok with  administration.  Questions answered.  3.  Tachycardia - had cardiology follow up scheduled 01/03/2021  4.  Low back pain without sciatica - belly band discussed - heat and tylenol, stretching - referral to physical therapy placed - pt reassured symptoms do not seem to be preterm labor at this point  5.  Pelvic kidney in fetus - has serial growth scans scheduled with MFM  Preterm labor symptoms and general obstetric precautions including but not limited to vaginal bleeding, contractions, leaking of fluid and fetal movement were reviewed in detail with the patient. Please refer to After Visit Summary for other counseling recommendations.   Return in about 2 weeks (around 01/18/2021) for already has appt with Dr. Debroah Loop.  Future Appointments  Date Time Provider Department Center  01/13/2021  8:00 AM [facility] None  01/13/2021  8:15 AM Adam Phenix, MD [facility] None  01/13/2021  3:20 PM Thomasene Ripple, DO [facility] None  01/17/2021  8:45 AM [facility] NURSE [facility]  01/17/2021  9:00 AM [facility] US1 [facility]    Jerene Bears, MD

## 2021-01-13 ENCOUNTER — Other Ambulatory Visit: Payer: Self-pay

## 2021-01-13 ENCOUNTER — Ambulatory Visit (INDEPENDENT_AMBULATORY_CARE_PROVIDER_SITE_OTHER): Payer: 59 | Admitting: Obstetrics & Gynecology

## 2021-01-13 ENCOUNTER — Other Ambulatory Visit: Payer: 59

## 2021-01-13 ENCOUNTER — Ambulatory Visit: Payer: 59 | Admitting: Cardiology

## 2021-01-13 VITALS — BP 118/85 | HR 102 | Wt 149.0 lb

## 2021-01-13 DIAGNOSIS — Z23 Encounter for immunization: Secondary | ICD-10-CM

## 2021-01-13 DIAGNOSIS — Z3402 Encounter for supervision of normal first pregnancy, second trimester: Secondary | ICD-10-CM

## 2021-01-13 DIAGNOSIS — Q632 Ectopic kidney: Secondary | ICD-10-CM

## 2021-01-13 NOTE — Progress Notes (Signed)
ROB [redacted]w[redacted]d PHQ-9=3 GAD 7=0  Tdap: Desires today.   *Pt wants to discuss having a midwife delivering her and circumcision.

## 2021-01-13 NOTE — Progress Notes (Signed)
   PRENATAL VISIT NOTE  Subjective:  Claudia Davis is a 30 y.o. G1P0000 at [redacted]w[redacted]d being seen today for ongoing prenatal care.  She is currently monitored for the following issues for this high-risk pregnancy and has Stress and adjustment reaction; Depression with anxiety; Encounter for supervision of normal first pregnancy in second trimester; Dizziness of unknown cause; Pelvic kidney; Seasonal allergies; Depression; Anxiety; and Anovulation on their problem list.  Patient reports no complaints.  Contractions: Not present. Vag. Bleeding: None.  Movement: Present. Denies leaking of fluid.   The following portions of the patient's history were reviewed and updated as appropriate: allergies, current medications, past family history, past medical history, past social history, past surgical history and problem list.   Objective:   Vitals:   01/13/21 0817  BP: 118/85  Pulse: (!) 102  Weight: 149 lb (67.6 kg)    Fetal Status: Fetal Heart Rate (bpm): 150   Movement: Present     General:  Alert, oriented and cooperative. Patient is in no acute distress.  Skin: Skin is warm and dry. No rash noted.   Cardiovascular: Normal heart rate noted  Respiratory: Normal respiratory effort, no problems with respiration noted  Abdomen: Soft, gravid, appropriate for gestational age.  Pain/Pressure: Absent     Pelvic: Cervical exam deferred        Extremities: Normal range of motion.  Edema: Trace  Mental Status: Normal mood and affect. Normal behavior. Normal judgment and thought content.   Assessment and Plan:  Pregnancy: G1P0000 at [redacted]w[redacted]d 1. Encounter for supervision of normal first pregnancy in second trimester Routine testing - Glucose Tolerance, 2 Hours w/1 Hour - RPR - HIV antibody (with reflex) - CBC  2. Pelvic kidney Fetal, has MFM f/u  3. Need for Tdap vaccination routine - Tdap vaccine greater than or equal to 7yo IM  Preterm labor symptoms and general obstetric precautions  including but not limited to vaginal bleeding, contractions, leaking of fluid and fetal movement were reviewed in detail with the patient. Please refer to After Visit Summary for other counseling recommendations.   Return in about 4 weeks (around 02/10/2021).  Future Appointments  Date Time Provider Department Center  01/17/2021  8:45 AM [facility] NURSE [facility]  01/17/2021  9:00 AM [facility] US1 [facility]    Scheryl Darter, MD

## 2021-01-13 NOTE — Patient Instructions (Signed)
Third Trimester of Pregnancy  The third trimester of pregnancy is from week 28 through week 40. This is also called months 7 through 9. This trimester is when your unborn baby (fetus) is growing very fast. At the end of the ninth month, the unborn baby is about20 inches long. It weighs about 6-10 pounds. Body changes during your third trimester Your body continues to go through many changes during this time. The changesvary and generally return to normal after the baby is born. Physical changes Your weight will continue to increase. You may gain 25-35 pounds (11-16 kg) by the end of the pregnancy. If you are underweight, you may gain 28-40 lb (about 13-18 kg). If you are overweight, you may gain 15-25 lb (about 7-11 kg). You may start to get stretch marks on your hips, belly (abdomen), and breasts. Your breasts will continue to grow and may hurt. A yellow fluid (colostrum) may leak from your breasts. This is the first milk you are making for your baby. You may have changes in your hair. Your belly button may stick out. You may have more swelling in your hands, face, or ankles. Health changes You may have heartburn. You may have trouble pooping (constipation). You may get hemorrhoids. These are swollen veins in the butt that can itch or get painful. You may have swollen veins (varicose veins) in your legs. You may have more body aches in the pelvis, back, or thighs. You may have more tingling or numbness in your hands, arms, and legs. The skin on your belly may also feel numb. You may feel short of breath as your womb (uterus) gets bigger. Other changes You may pee (urinate) more often. You may have more problems sleeping. You may notice the unborn baby "dropping," or moving lower in your belly. You may have more discharge coming from your vagina. Your joints may feel loose, and you may have pain around your pelvic bone. Follow these instructions at home: Medicines Take over-the-counter  and prescription medicines only as told by your doctor. Some medicines are not safe during pregnancy. Take a prenatal vitamin that contains at least 600 micrograms (mcg) of folic acid. Eating and drinking Eat healthy meals that include: Fresh fruits and vegetables. Whole grains. Good sources of protein, such as meat, eggs, or tofu. Low-fat dairy products. Avoid raw meat and unpasteurized juice, milk, and cheese. These carry germs that can harm you and your baby. Eat 4 or 5 small meals rather than 3 large meals a day. You may need to take these actions to prevent or treat trouble pooping: Drink enough fluids to keep your pee (urine) pale yellow. Eat foods that are high in fiber. These include beans, whole grains, and fresh fruits and vegetables. Limit foods that are high in fat and sugar. These include fried or sweet foods. Activity Exercise only as told by your doctor. Stop exercising if you start to have cramps in your womb. Avoid heavy lifting. Do not exercise if it is too hot or too humid, or if you are in a place of great height (high altitude). If you choose to, you may have sex unless your doctor tells you not to. Relieving pain and discomfort Take breaks often, and rest with your legs raised (elevated) if you have leg cramps or low back pain. Take warm water baths (sitz baths) to soothe pain or discomfort caused by hemorrhoids. Use hemorrhoid cream if your doctor approves. Wear a good support bra if your breasts are tender. If   you develop bulging, swollen veins in your legs: Wear support hose as told by your doctor. Raise your feet for 15 minutes, 3-4 times a day. Limit salt in your food. Safety Talk to your doctor before traveling far distances. Do not use hot tubs, steam rooms, or saunas. Wear your seat belt at all times when you are in a car. Talk with your doctor if someone is hurting you or yelling at you a lot. Preparing for your baby's arrival To prepare for the arrival  of your baby: Take prenatal classes. Visit the hospital and tour the maternity area. Buy a rear-facing car seat. Learn how to install it in your car. Prepare the baby's room. Take out all pillows and stuffed animals from the baby's crib. General instructions Avoid cat litter boxes and soil used by cats. These carry germs that can cause harm to the baby and can cause a loss of your baby by miscarriage or stillbirth. Do not douche or use tampons. Do not use scented sanitary pads. Do not smoke or use any products that contain nicotine or tobacco. If you need help quitting, ask your doctor. Do not drink alcohol. Do not use herbal medicines, illegal drugs, or medicines that were not approved by your doctor. Chemicals in these products can affect your baby. Keep all follow-up visits. This is important. Where to find more information American Pregnancy Association: americanpregnancy.org American College of Obstetricians and Gynecologists: www.acog.org Office on Women's Health: womenshealth.gov/pregnancy Contact a doctor if: You have a fever. You have mild cramps or pressure in your lower belly. You have a nagging pain in your belly area. You vomit, or you have watery poop (diarrhea). You have bad-smelling fluid coming from your vagina. You have pain when you pee, or your pee smells bad. You have a headache that does not go away when you take medicine. You have changes in how you see, or you see spots in front of your eyes. Get help right away if: Your water breaks. You have regular contractions that are less than 5 minutes apart. You are spotting or bleeding from your vagina. You have very bad belly cramps or pain. You have trouble breathing. You have chest pain. You faint. You have not felt the baby move for the amount of time told by your doctor. You have new or increased pain, swelling, or redness in an arm or leg. Summary The third trimester is from week 28 through week 40 (months 7  through 9). This is the time when your unborn baby is growing very fast. During this time, your discomfort may increase as you gain weight and as your baby grows. Get ready for your baby to arrive by taking prenatal classes, buying a rear-facing car seat, and preparing the baby's room. Get help right away if you are bleeding from your vagina, you have chest pain and trouble breathing, or you have not felt the baby move for the amount of time told by your doctor. This information is not intended to replace advice given to you by your health care provider. Make sure you discuss any questions you have with your healthcare provider. Document Revised: 12/24/2019 Document Reviewed: 10/30/2019 Elsevier Patient Education  2022 [employer]  

## 2021-01-14 LAB — CBC
Hematocrit: 36 % (ref 34.0–46.6)
Hemoglobin: 12.1 g/dL (ref 11.1–15.9)
MCH: 32.8 pg (ref 26.6–33.0)
MCHC: 33.6 g/dL (ref 31.5–35.7)
MCV: 98 fL — ABNORMAL HIGH (ref 79–97)
Platelets: 218 10*3/uL (ref 150–450)
RBC: 3.69 x10E6/uL — ABNORMAL LOW (ref 3.77–5.28)
RDW: 12.2 % (ref 11.7–15.4)
WBC: 8.1 10*3/uL (ref 3.4–10.8)

## 2021-01-14 LAB — RPR: RPR Ser Ql: NONREACTIVE

## 2021-01-14 LAB — GLUCOSE TOLERANCE, 2 HOURS W/ 1HR
Glucose, 1 hour: 58 mg/dL — ABNORMAL LOW (ref 65–179)
Glucose, 2 hour: 73 mg/dL (ref 65–152)
Glucose, Fasting: 82 mg/dL (ref 65–91)

## 2021-01-14 LAB — HIV ANTIBODY (ROUTINE TESTING W REFLEX): HIV Screen 4th Generation wRfx: NONREACTIVE

## 2021-01-17 ENCOUNTER — Other Ambulatory Visit: Payer: Self-pay | Admitting: *Deleted

## 2021-01-17 ENCOUNTER — Encounter: Payer: Self-pay | Admitting: *Deleted

## 2021-01-17 ENCOUNTER — Other Ambulatory Visit: Payer: Self-pay

## 2021-01-17 ENCOUNTER — Ambulatory Visit: Payer: 59 | Admitting: *Deleted

## 2021-01-17 ENCOUNTER — Ambulatory Visit: Payer: 59 | Attending: Obstetrics and Gynecology

## 2021-01-17 VITALS — BP 121/66 | HR 92

## 2021-01-17 DIAGNOSIS — O359XX0 Maternal care for (suspected) fetal abnormality and damage, unspecified, not applicable or unspecified: Secondary | ICD-10-CM | POA: Insufficient documentation

## 2021-01-17 DIAGNOSIS — Z3402 Encounter for supervision of normal first pregnancy, second trimester: Secondary | ICD-10-CM

## 2021-01-17 DIAGNOSIS — Q632 Ectopic kidney: Secondary | ICD-10-CM

## 2021-01-17 DIAGNOSIS — Z3A27 27 weeks gestation of pregnancy: Secondary | ICD-10-CM

## 2021-01-30 ENCOUNTER — Other Ambulatory Visit: Payer: Self-pay

## 2021-01-30 ENCOUNTER — Encounter (HOSPITAL_COMMUNITY): Payer: Self-pay | Admitting: Obstetrics and Gynecology

## 2021-01-30 ENCOUNTER — Inpatient Hospital Stay (HOSPITAL_COMMUNITY)
Admission: AD | Admit: 2021-01-30 | Discharge: 2021-01-31 | Disposition: A | Discharge status: Home / Self Care | Payer: 59 | Source: Ambulatory Visit | Attending: Obstetrics and Gynecology | Admitting: Obstetrics and Gynecology

## 2021-01-30 DIAGNOSIS — Z8616 Personal history of COVID-19: Secondary | ICD-10-CM | POA: Diagnosis not present

## 2021-01-30 DIAGNOSIS — O479 False labor, unspecified: Secondary | ICD-10-CM

## 2021-01-30 DIAGNOSIS — Z3A29 29 weeks gestation of pregnancy: Secondary | ICD-10-CM | POA: Diagnosis not present

## 2021-01-30 DIAGNOSIS — O4703 False labor before 37 completed weeks of gestation, third trimester: Secondary | ICD-10-CM | POA: Diagnosis present

## 2021-01-30 DIAGNOSIS — R42 Dizziness and giddiness: Secondary | ICD-10-CM | POA: Diagnosis not present

## 2021-01-30 DIAGNOSIS — O26893 Other specified pregnancy related conditions, third trimester: Secondary | ICD-10-CM | POA: Diagnosis not present

## 2021-01-30 DIAGNOSIS — Z79899 Other long term (current) drug therapy: Secondary | ICD-10-CM | POA: Insufficient documentation

## 2021-01-30 DIAGNOSIS — R102 Pelvic and perineal pain: Secondary | ICD-10-CM | POA: Insufficient documentation

## 2021-01-30 MED ORDER — CYCLOBENZAPRINE HCL 5 MG PO TABS
10.0000 mg | ORAL_TABLET | Freq: Once | ORAL | Status: AC
  Administered 2021-01-30: 10 mg via ORAL
  Filled 2021-01-30: qty 2

## 2021-01-30 NOTE — MAU Provider Note (Signed)
History     CSN: [id number]  Arrival date and time: 01/30/21 2056   Event Date/Time   First Provider Initiated Contact with Patient 01/30/21 2233      Chief Complaint  Patient presents with   Contractions   HPI Claudia Davis is a 30 y.o. G1P0000 at [redacted]w[redacted]d who presents with pelvic pain. She states she has pain that starts in her back and wraps around to her pelvis. She reports it is constant and rates the pain a 7/10. She has not tried anything for the pain. She also thinks she is having contractions. She is unsure how often. She reports sometimes the contractions make her feel light headed. She reports this happened twice this week requiring her to leave work. She denies any bleeding or leaking. She reports normal fetal movement.   OB History     Gravida  1   Para  0   Term  0   Preterm  0   AB  0   Living         SAB  0   IAB  0   Ectopic  0   Multiple      Live Births              Past Medical History:  Diagnosis Date   Anxiety 08/2019   COVID-19    Depression 08/2019   Seasonal allergies     Past Surgical History:  Procedure Laterality Date   APPENDECTOMY  01/2019    Family History  Problem Relation Age of Onset   Anxiety disorder Mother    Hypertension Father     Social History   Tobacco Use   Smoking status: Never   Smokeless tobacco: Never  Vaping Use   Vaping Use: Never used  Substance Use Topics   Alcohol use: Not Currently    Comment: last drink about a year ago   Drug use: No    Allergies:  Allergies  Allergen Reactions   Amoxicillin Swelling    Throat swells     Medications Prior to Admission  Medication Sig Dispense Refill Last Dose   loratadine (CLARITIN) 10 MG tablet Take 10 mg by mouth daily.   Past Month   Prenatal Multivit-Min-Fe-FA (PRE-NATAL FORMULA) TABS Take one daily. 30 tablet 2 Past Week   promethazine (PHENERGAN) 25 MG tablet Take 1 tablet (25 mg total) by mouth every 6 (six) hours as needed for  nausea or vomiting. (Patient not taking: Reported on 01/17/2021) 30 tablet 1     Review of Systems  Constitutional: Negative.  Negative for fatigue and fever.  HENT: Negative.    Respiratory: Negative.  Negative for shortness of breath.   Cardiovascular: Negative.  Negative for chest pain.  Gastrointestinal:  Positive for abdominal pain. Negative for constipation, diarrhea, nausea and vomiting.  Genitourinary:  Positive for pelvic pain. Negative for dysuria, vaginal bleeding and vaginal discharge.  Neurological: Negative.  Negative for dizziness and headaches.  Physical Exam   Blood pressure 126/76, pulse (!) 103, temperature 98 F (36.7 C), resp. rate 18, height 4\' 11"  (1.499 m), weight 66.2 kg, last menstrual period 07/05/2020.  Physical Exam Vitals and nursing note reviewed.  Constitutional:      General: She is not in acute distress.    Appearance: She is well-developed.  HENT:     Head: Normocephalic.  Eyes:     Pupils: Pupils are equal, round, and reactive to light.  Cardiovascular:     Rate and Rhythm:  Normal rate and regular rhythm.     Heart sounds: Normal heart sounds.  Pulmonary:     Effort: Pulmonary effort is normal. No respiratory distress.     Breath sounds: Normal breath sounds.  Abdominal:     General: Bowel sounds are normal. There is no distension.     Palpations: Abdomen is soft.     Tenderness: There is no abdominal tenderness.  Skin:    General: Skin is warm and dry.  Neurological:     Mental Status: She is alert and oriented to person, place, and time.  Psychiatric:        Mood and Affect: Mood normal.        Behavior: Behavior normal.        Thought Content: Thought content normal.        Judgment: Judgment normal.    Fetal Tracing:  Baseline: 135 Variability: moderate Accels: 15x15 Decels: none  Toco: 1 contraction throughout all monitoring  Dilation: Closed Effacement (%): Thick Exam by:: Cleone Slim, CNM   MAU Course   Procedures Results for orders placed or performed during the hospital encounter of 01/30/21 (from the past 24 hour(s))  Urinalysis, Routine w reflex microscopic Urine, Clean Catch     Status: None   Collection Time: 01/30/21 11:51 PM  Result Value Ref Range   Color, Urine YELLOW YELLOW   APPearance CLEAR CLEAR   Specific Gravity, Urine 1.012 1.005 - 1.030   pH 6.0 5.0 - 8.0   Glucose, UA NEGATIVE NEGATIVE mg/dL   Hgb urine dipstick NEGATIVE NEGATIVE   Bilirubin Urine NEGATIVE NEGATIVE   Ketones, ur NEGATIVE NEGATIVE mg/dL   Protein, ur NEGATIVE NEGATIVE mg/dL   Nitrite NEGATIVE NEGATIVE   Leukocytes,Ua NEGATIVE NEGATIVE    MDM UA Flexeril PO- patient reports complete resolution of pain No contractions, no cervical dilation- low suspicion for preterm labor at this time.   Assessment and Plan   1. Pelvic pain affecting pregnancy in third trimester, antepartum   2. Braxton Hick's contraction   3. [redacted] weeks gestation of pregnancy    -Discharge home in stable condition -Rx for flexeril sent to patient's pharmacy -Preterm labor precautions discussed -Patient advised to follow-up with OB as scheduled for prenatal care -Patient may return to MAU as needed or if her condition were to change or worsen   Rolm Bookbinder CNM 01/30/2021, 10:33 PM

## 2021-01-30 NOTE — MAU Note (Signed)
Thought she was having bxt hicks ctx off and on for a few days.  They stared up again around &;#0 . They feel a lot stronger and closer than they have been about every 1-2 min. Denies any vag bleeding reports increased mucusy discharge. Good fetal movement  felt.

## 2021-01-31 DIAGNOSIS — Z3A29 29 weeks gestation of pregnancy: Secondary | ICD-10-CM

## 2021-01-31 DIAGNOSIS — R102 Pelvic and perineal pain: Secondary | ICD-10-CM

## 2021-01-31 DIAGNOSIS — O4703 False labor before 37 completed weeks of gestation, third trimester: Secondary | ICD-10-CM

## 2021-01-31 DIAGNOSIS — O26893 Other specified pregnancy related conditions, third trimester: Secondary | ICD-10-CM

## 2021-01-31 LAB — URINALYSIS, ROUTINE W REFLEX MICROSCOPIC
Bilirubin Urine: NEGATIVE
Glucose, UA: NEGATIVE mg/dL
Hgb urine dipstick: NEGATIVE
Ketones, ur: NEGATIVE mg/dL
Leukocytes,Ua: NEGATIVE
Nitrite: NEGATIVE
Protein, ur: NEGATIVE mg/dL
Specific Gravity, Urine: 1.012 (ref 1.005–1.030)
pH: 6 (ref 5.0–8.0)

## 2021-01-31 MED ORDER — CYCLOBENZAPRINE HCL 10 MG PO TABS: 10.0000 mg | ORAL_TABLET | Freq: Two times a day (BID) | ORAL | 0 refills | Status: DC | PRN

## 2021-01-31 NOTE — Discharge Instructions (Signed)

## 2021-02-10 ENCOUNTER — Ambulatory Visit (INDEPENDENT_AMBULATORY_CARE_PROVIDER_SITE_OTHER): Payer: 59 | Admitting: Obstetrics and Gynecology

## 2021-02-10 ENCOUNTER — Other Ambulatory Visit: Payer: Self-pay

## 2021-02-10 ENCOUNTER — Encounter: Payer: Self-pay | Admitting: Obstetrics and Gynecology

## 2021-02-10 VITALS — BP 123/86 | HR 108 | Wt 149.4 lb

## 2021-02-10 DIAGNOSIS — Z3402 Encounter for supervision of normal first pregnancy, second trimester: Secondary | ICD-10-CM

## 2021-02-10 DIAGNOSIS — Q632 Ectopic kidney: Secondary | ICD-10-CM

## 2021-02-10 NOTE — Progress Notes (Signed)
ROB 30.5wks  MAU visit 7/3 for back pain and "contractions."  Today complains of lower abd/ pelvic pain all day that does not improve with changing positions.

## 2021-02-10 NOTE — Progress Notes (Signed)
Subjective:  Claudia Davis is a 30 y.o. G1P0000 at [redacted]w[redacted]d being seen today for ongoing prenatal care.  She is currently monitored for the following issues for this low-risk pregnancy and has Stress and adjustment reaction; Depression with anxiety; Encounter for supervision of normal first pregnancy in second trimester; Pelvic kidney; Seasonal allergies; Depression; and Anxiety on their problem list.  Patient reports general discomforts of pregnancy.  Contractions: Irritability. Vag. Bleeding: None.  Movement: Present. Denies leaking of fluid.   The following portions of the patient's history were reviewed and updated as appropriate: allergies, current medications, past family history, past medical history, past social history, past surgical history and problem list. Problem list updated.  Objective:   Vitals:   02/10/21 1437  BP: 123/86  Pulse: (!) 108  Weight: 149 lb 6.4 oz (67.8 kg)    Fetal Status: Fetal Heart Rate (bpm): 150   Movement: Present     General:  Alert, oriented and cooperative. Patient is in no acute distress.  Skin: Skin is warm and dry. No rash noted.   Cardiovascular: Normal heart rate noted  Respiratory: Normal respiratory effort, no problems with respiration noted  Abdomen: Soft, gravid, appropriate for gestational age. Pain/Pressure: Present     Pelvic:  Cervical exam deferred        Extremities: Normal range of motion.  Edema: None  Mental Status: Normal mood and affect. Normal behavior. Normal judgment and thought content.   Urinalysis:      Assessment and Plan:  Pregnancy: G1P0000 at [redacted]w[redacted]d  1. Encounter for supervision of normal first pregnancy in second trimester Stable  2. Pelvic kidney, fetal Growth scan next week  Preterm labor symptoms and general obstetric precautions including but not limited to vaginal bleeding, contractions, leaking of fluid and fetal movement were reviewed in detail with the patient. Please refer to After Visit Summary for  other counseling recommendations.  Return in about 2 weeks (around 02/24/2021) for OB visit, face to face, any provider.   Hermina Staggers, MD

## 2021-02-10 NOTE — Patient Instructions (Signed)

## 2021-02-14 ENCOUNTER — Encounter: Payer: Self-pay | Admitting: *Deleted

## 2021-02-14 ENCOUNTER — Other Ambulatory Visit: Payer: Self-pay

## 2021-02-14 ENCOUNTER — Ambulatory Visit: Payer: 59 | Admitting: *Deleted

## 2021-02-14 ENCOUNTER — Ambulatory Visit: Payer: 59 | Attending: Obstetrics

## 2021-02-14 VITALS — BP 117/77 | HR 100

## 2021-02-14 DIAGNOSIS — Z3A31 31 weeks gestation of pregnancy: Secondary | ICD-10-CM | POA: Diagnosis not present

## 2021-02-14 DIAGNOSIS — O359XX0 Maternal care for (suspected) fetal abnormality and damage, unspecified, not applicable or unspecified: Secondary | ICD-10-CM | POA: Diagnosis not present

## 2021-02-14 DIAGNOSIS — Q632 Ectopic kidney: Secondary | ICD-10-CM | POA: Diagnosis present

## 2021-02-14 DIAGNOSIS — Z3402 Encounter for supervision of normal first pregnancy, second trimester: Secondary | ICD-10-CM | POA: Insufficient documentation

## 2021-02-15 ENCOUNTER — Other Ambulatory Visit: Payer: Self-pay | Admitting: *Deleted

## 2021-02-15 DIAGNOSIS — O35EXX0 Maternal care for other (suspected) fetal abnormality and damage, fetal genitourinary anomalies, not applicable or unspecified: Secondary | ICD-10-CM

## 2021-02-15 DIAGNOSIS — O358XX0 Maternal care for other (suspected) fetal abnormality and damage, not applicable or unspecified: Secondary | ICD-10-CM

## 2021-02-16 ENCOUNTER — Inpatient Hospital Stay (HOSPITAL_COMMUNITY)
Admission: AD | Admit: 2021-02-16 | Discharge: 2021-02-16 | Disposition: A | Discharge status: Home / Self Care | Payer: 59 | Attending: Obstetrics & Gynecology | Admitting: Obstetrics & Gynecology

## 2021-02-16 ENCOUNTER — Telehealth: Payer: Self-pay

## 2021-02-16 DIAGNOSIS — O4703 False labor before 37 completed weeks of gestation, third trimester: Secondary | ICD-10-CM | POA: Diagnosis not present

## 2021-02-16 DIAGNOSIS — Z3402 Encounter for supervision of normal first pregnancy, second trimester: Secondary | ICD-10-CM

## 2021-02-16 DIAGNOSIS — Z8616 Personal history of COVID-19: Secondary | ICD-10-CM | POA: Insufficient documentation

## 2021-02-16 DIAGNOSIS — Z79899 Other long term (current) drug therapy: Secondary | ICD-10-CM | POA: Insufficient documentation

## 2021-02-16 DIAGNOSIS — R1032 Left lower quadrant pain: Secondary | ICD-10-CM | POA: Diagnosis not present

## 2021-02-16 DIAGNOSIS — Z3A31 31 weeks gestation of pregnancy: Secondary | ICD-10-CM | POA: Diagnosis not present

## 2021-02-16 DIAGNOSIS — Z3689 Encounter for other specified antenatal screening: Secondary | ICD-10-CM | POA: Diagnosis not present

## 2021-02-16 DIAGNOSIS — O98813 Other maternal infectious and parasitic diseases complicating pregnancy, third trimester: Secondary | ICD-10-CM | POA: Diagnosis not present

## 2021-02-16 DIAGNOSIS — B373 Candidiasis of vulva and vagina: Secondary | ICD-10-CM | POA: Diagnosis not present

## 2021-02-16 DIAGNOSIS — O26893 Other specified pregnancy related conditions, third trimester: Secondary | ICD-10-CM | POA: Diagnosis not present

## 2021-02-16 DIAGNOSIS — Q632 Ectopic kidney: Secondary | ICD-10-CM

## 2021-02-16 LAB — URINALYSIS, ROUTINE W REFLEX MICROSCOPIC
Bilirubin Urine: NEGATIVE
Glucose, UA: NEGATIVE mg/dL
Ketones, ur: 20 mg/dL — AB
Nitrite: NEGATIVE
Protein, ur: NEGATIVE mg/dL
Specific Gravity, Urine: 1.003 — ABNORMAL LOW (ref 1.005–1.030)
pH: 7 (ref 5.0–8.0)

## 2021-02-16 LAB — WET PREP, GENITAL
Clue Cells Wet Prep HPF POC: NONE SEEN
Sperm: NONE SEEN
Trich, Wet Prep: NONE SEEN

## 2021-02-16 MED ORDER — TERCONAZOLE 0.4 % VA CREA: 1.0000 | TOPICAL_CREAM | Freq: Every day | VAGINAL | 0 refills | Status: DC

## 2021-02-16 MED ORDER — ACETAMINOPHEN 500 MG PO TABS
1000.0000 mg | ORAL_TABLET | Freq: Once | ORAL | Status: AC
  Administered 2021-02-16: 1000 mg via ORAL
  Filled 2021-02-16: qty 2

## 2021-02-16 MED ORDER — LACTATED RINGERS IV BOLUS
1000.0000 mL | Freq: Once | INTRAVENOUS | Status: AC
  Administered 2021-02-16: 1000 mL via INTRAVENOUS

## 2021-02-16 NOTE — Telephone Encounter (Signed)
Patients sister-Savannah called stating that patient has been having contractions about two hour ago. Pain is growing in intensity, lasting about 1 minutes, and coming about every 5 minutes. I can hear patient groaning in the background. Patient has taken flexeril with no relief. Advised patient to go to MAU for evaluation.

## 2021-02-16 NOTE — MAU Provider Note (Signed)
History     CSN: [id number]  Arrival date and time: 02/16/21 1608   Event Date/Time   First Provider Initiated Contact with Patient 02/16/21 1613      No chief complaint on file.  Claudia Davis is a 30 y.o. G1P0 at [redacted]w[redacted]d who receives care at [facility].  She presents today for contractions. Patient reports pain started around 12pm while at work.  She states despite sitting down the pain continued.  Patient reports the pain is located in LLQ, is constant, and cramping in nature. She reports taking a Flexeril around 1515 with no relief of symptoms.  She rates the pain a 7/10.  She denies LOF, VB, and vaginal discharge.  She states she has not felt fetal movement in the past hour, but has felt movement today.  She reports having breakfast around 1030.  Cheerios with milk Water 40 ounces     OB History     Gravida  1   Para  0   Term  0   Preterm  0   AB  0   Living         SAB  0   IAB  0   Ectopic  0   Multiple      Live Births              Past Medical History:  Diagnosis Date   Anxiety 08/2019   COVID-19    Depression 08/2019   Seasonal allergies     Past Surgical History:  Procedure Laterality Date   APPENDECTOMY  01/2019    Family History  Problem Relation Age of Onset   Anxiety disorder Mother    Hypertension Father     Social History   Tobacco Use   Smoking status: Never   Smokeless tobacco: Never  Vaping Use   Vaping Use: Never used  Substance Use Topics   Alcohol use: Not Currently    Comment: last drink about a year ago   Drug use: No    Allergies:  Allergies  Allergen Reactions   Amoxicillin Swelling    Throat swells     Medications Prior to Admission  Medication Sig Dispense Refill Last Dose   cyclobenzaprine (FLEXERIL) 10 MG tablet Take 1 tablet (10 mg total) by mouth 2 (two) times daily as needed for muscle spasms. 20 tablet 0    Prenatal Multivit-Min-Fe-FA (PRE-NATAL FORMULA) TABS Take one daily. 30 tablet  2    promethazine (PHENERGAN) 25 MG tablet Take 1 tablet (25 mg total) by mouth every 6 (six) hours as needed for nausea or vomiting. 30 tablet 1     Review of Systems  Constitutional:  Negative for chills and fever.  Gastrointestinal:  Positive for abdominal pain (LLQ), diarrhea (Monday-Loose) and nausea (None currently). Negative for constipation and vomiting.  Genitourinary:  Negative for difficulty urinating, dysuria, vaginal bleeding and vaginal discharge.  Musculoskeletal:  Positive for back pain.  Neurological:  Negative for dizziness, light-headedness and headaches.  Physical Exam   Last menstrual period 07/05/2020.  Physical Exam Vitals reviewed.  Constitutional:      Appearance: Normal appearance.  HENT:     Head: Normocephalic and atraumatic.  Eyes:     Conjunctiva/sclera: Conjunctivae normal.  Cardiovascular:     Rate and Rhythm: Normal rate and regular rhythm.     Heart sounds: Normal heart sounds.  Pulmonary:     Effort: Pulmonary effort is normal. No respiratory distress.     Breath sounds: Normal breath  sounds.  Abdominal:     General: Bowel sounds are normal.     Tenderness: There is no abdominal tenderness.  Musculoskeletal:        General: Normal range of motion.     Cervical back: Normal range of motion.  Skin:    General: Skin is warm and dry.  Neurological:     Mental Status: She is alert and oriented to person, place, and time.  Psychiatric:        Mood and Affect: Mood normal.        Behavior: Behavior normal.        Thought Content: Thought content normal.    Fetal Assessment 140 bpm, Mod Var, -Decels, +Accels Toco: No ctx graphed  MAU Course   Results for orders placed or performed during the hospital encounter of 02/16/21 (from the past 24 hour(s))  Wet prep, genital     Status: Abnormal   Collection Time: 02/16/21  4:16 PM   Specimen: Vaginal  Result Value Ref Range   Yeast Wet Prep HPF POC PRESENT (A) NONE SEEN   Trich, Wet Prep  NONE SEEN NONE SEEN   Clue Cells Wet Prep HPF POC NONE SEEN NONE SEEN   WBC, Wet Prep HPF POC MANY (A) NONE SEEN   Sperm NONE SEEN   Urinalysis, Routine w reflex microscopic Urine, Clean Catch     Status: Abnormal   Collection Time: 02/16/21  4:46 PM  Result Value Ref Range   Color, Urine STRAW (A) YELLOW   APPearance HAZY (A) CLEAR   Specific Gravity, Urine 1.003 (L) 1.005 - 1.030   pH 7.0 5.0 - 8.0   Glucose, UA NEGATIVE NEGATIVE mg/dL   Hgb urine dipstick SMALL (A) NEGATIVE   Bilirubin Urine NEGATIVE NEGATIVE   Ketones, ur 20 (A) NEGATIVE mg/dL   Protein, ur NEGATIVE NEGATIVE mg/dL   Nitrite NEGATIVE NEGATIVE   Leukocytes,Ua LARGE (A) NEGATIVE   RBC / HPF 0-5 0 - 5 RBC/hpf   WBC, UA 6-10 0 - 5 WBC/hpf   Bacteria, UA RARE (A) NONE SEEN   Squamous Epithelial / LPF 6-10 0 - 5   Mucus PRESENT    No results found.  MDM PE Labs: Wet prep, GC/CT EFM Analgesic IV Fluids Assessment and Plan  30 year old G1P0  SIUP at 23.4weeks Cat I FT Abdominal Cramping  -Exam performed and findings discussed. -Informed that cervix is closed. -Wet prep and GC/CT collected via blind swab. -Will give IV fluids. -Patient offered and accepts pain medication. -Will await for [facility] results before prescribing. -NST Reactive. -Will monitor and reassess.  Cherre Robins MSN, CNM 02/16/2021, 4:14 PM   Reassessment (5:09 PM) -Wet prep returns with +yeast -Will give tylenol for pain and reassess.  Reassessment (6:37 PM)  -Patient reports improvement in pain; 3/10 -Informed of wet prep findings. -Reviewed treatment with Terazol 7. -Patient confirms pharmacy and Rx sent. -Encouraged to rest and hydrate. -Will give OOW excuse for tomorrow. -UA returns with large leuks, small hgb, and rare bacteria.  Will send for culture.  -Encouraged to call or return to MAU if symptoms worsen or with the onset of new symptoms. -Instructed to keep new appt as scheduled. -Discharged to home in stable  condition.  Cherre Robins MSN, CNM Advanced Practice Provider, [facility] [employer]

## 2021-02-17 LAB — GC/CHLAMYDIA PROBE AMP (~~LOC~~) NOT AT ARMC
Chlamydia: NEGATIVE
Comment: NEGATIVE
Comment: NORMAL
Neisseria Gonorrhea: NEGATIVE

## 2021-02-24 ENCOUNTER — Other Ambulatory Visit: Payer: Self-pay

## 2021-02-24 ENCOUNTER — Ambulatory Visit (INDEPENDENT_AMBULATORY_CARE_PROVIDER_SITE_OTHER): Payer: 59

## 2021-02-24 VITALS — BP 114/83 | HR 100 | Wt 149.6 lb

## 2021-02-24 DIAGNOSIS — R102 Pelvic and perineal pain: Secondary | ICD-10-CM

## 2021-02-24 DIAGNOSIS — Z3A32 32 weeks gestation of pregnancy: Secondary | ICD-10-CM

## 2021-02-24 DIAGNOSIS — O26893 Other specified pregnancy related conditions, third trimester: Secondary | ICD-10-CM

## 2021-02-24 DIAGNOSIS — Z3402 Encounter for supervision of normal first pregnancy, second trimester: Secondary | ICD-10-CM

## 2021-02-24 NOTE — Progress Notes (Signed)
Pt reports fetal movement with some pressure. 

## 2021-02-24 NOTE — Progress Notes (Signed)
   LOW-RISK PREGNANCY OFFICE VISIT  Patient name: Claudia Davis MRN [id number]  Date of birth: 06-11-1991 Chief Complaint:   Routine Prenatal Visit  Subjective:   CASSANDA WALMER is a 30 y.o. G28P0000 female at [redacted]w[redacted]d with an Estimated Date of Delivery: 04/16/21 being seen today for ongoing management of a low-risk pregnancy aeb has Stress and adjustment reaction; Depression with anxiety; Encounter for supervision of normal first pregnancy in second trimester; Pelvic kidney; Seasonal allergies; Depression; and Anxiety on their problem list.  Patient presents today with no complaints.  Patient endorses fetal movement. Patient endorses some intermittent abdominal cramping and BH contractions.  She reports it increases while at work.  Patient denies vaginal concerns including abnormal discharge, leaking of fluid, and bleeding.  Contractions: Not present. Vag. Bleeding: None.  Movement: Present.  Reviewed past medical,surgical, social, obstetrical and family history as well as problem list, medications and allergies.  Objective   Vitals:   02/24/21 1445  BP: 114/83  Pulse: 100  Weight: 149 lb 9.6 oz (67.9 kg)  Body mass index is 30.22 kg/m.  Total Weight Gain:6 lb 9.6 oz (2.994 kg)         Physical Examination:   General appearance: Well appearing, and in no distress  Mental status: Alert, oriented to person, place, and time  Skin: Warm & dry  Cardiovascular: Normal heart rate noted  Respiratory: Normal respiratory effort, no distress  Abdomen: Soft, gravid, nontender, AGA with    Pelvic: Cervical exam deferred           Extremities: Edema: None  Fetal Status: Fetal Heart Rate (bpm): 137  Movement: Present   No results found for this or any previous visit (from the past 24 hour(s)).  Assessment & Plan:  High-risk pregnancy of a 30 y.o., G1P0000 at [redacted]w[redacted]d with an Estimated Date of Delivery: 04/16/21   1. Encounter for supervision of normal first pregnancy in second  trimester -Anticipatory guidance for upcoming appts. -Patient to schedule next appt in 2 weeks for an in-person visit.   2. [redacted] weeks gestation of pregnancy -Doing well overall. -Briefly discussed concerns regarding POC for fetus after delivery. -Informed that provider unaware of mgmt of fetal pelvic kidney. -Encouraged to express concerns and ask questions with MFM. -Also encouraged to discuss POC with pediatrician. -Peds list given.   3. Pelvic pain affecting pregnancy in third trimester, antepartum -Encouraged to continue regular usage of maternity belt. -Reviewed need for regular breaks and high protein snacks especially while working. -Given work excuse expressing need for one 15 minute break every 2 hours.    Meds: No orders of the defined types were placed in this encounter.  Labs/procedures today:  Lab Orders  No laboratory test(s) ordered today     Reviewed: Preterm labor symptoms and general obstetric precautions including but not limited to vaginal bleeding, contractions, leaking of fluid and fetal movement were reviewed in detail with the patient.  All questions were answered.  Follow-up: No follow-ups on file.  No orders of the defined types were placed in this encounter.  Cherre Robins MSN, CNM 02/24/2021

## 2021-03-10 ENCOUNTER — Other Ambulatory Visit: Payer: Self-pay

## 2021-03-10 ENCOUNTER — Encounter: Payer: Self-pay | Admitting: Obstetrics and Gynecology

## 2021-03-10 ENCOUNTER — Ambulatory Visit (INDEPENDENT_AMBULATORY_CARE_PROVIDER_SITE_OTHER): Payer: 59 | Admitting: Obstetrics and Gynecology

## 2021-03-10 VITALS — BP 119/79 | HR 99 | Wt 151.7 lb

## 2021-03-10 DIAGNOSIS — Q632 Ectopic kidney: Secondary | ICD-10-CM

## 2021-03-10 DIAGNOSIS — Z3402 Encounter for supervision of normal first pregnancy, second trimester: Secondary | ICD-10-CM

## 2021-03-10 DIAGNOSIS — Z3A34 34 weeks gestation of pregnancy: Secondary | ICD-10-CM

## 2021-03-10 NOTE — Progress Notes (Signed)
   PRENATAL VISIT NOTE  Subjective:  Claudia Davis is a 30 y.o. G1P0000 at [redacted]w[redacted]d being seen today for ongoing prenatal care.  She is currently monitored for the following issues for this low-risk pregnancy and has Stress and adjustment reaction; Depression with anxiety; Encounter for supervision of normal first pregnancy in second trimester; Pelvic kidney; Seasonal allergies; Depression; and Anxiety on their problem list.  Patient reports some lower abdominal cramping and some dizziness.  Contractions: Irritability. Vag. Bleeding: None.  Movement: Present. Denies leaking of fluid.   The following portions of the patient's history were reviewed and updated as appropriate: allergies, current medications, past family history, past medical history, past social history, past surgical history and problem list.   Objective:   Vitals:   03/10/21 1600  BP: 119/79  Pulse: 99  Weight: 151 lb 11.2 oz (68.8 kg)    Fetal Status: Fetal Heart Rate (bpm): 146   Movement: Present     General:  Alert, oriented and cooperative. Patient is in no acute distress.  Skin: Skin is warm and dry. No rash noted.   Cardiovascular: Normal heart rate noted  Respiratory: Normal respiratory effort, no problems with respiration noted  Abdomen: Soft, gravid, appropriate for gestational age.  Pain/Pressure: Present     Pelvic: Cervical exam deferred        Extremities: Normal range of motion.  Edema: None  Mental Status: Normal mood and affect. Normal behavior. Normal judgment and thought content.   Assessment and Plan:  Pregnancy: G1P0000 at [redacted]w[redacted]d 1. Pelvic kidney Fetal F/u at MFM  2. Encounter for supervision of normal first pregnancy in second trimester - Reports she has had fewer dizzy spells this week - some pain shooting into vagina, likely RLP  3. [redacted] weeks gestation of pregnancy   Preterm labor symptoms and general obstetric precautions including but not limited to vaginal bleeding, contractions,  leaking of fluid and fetal movement were reviewed in detail with the patient. Please refer to After Visit Summary for other counseling recommendations.   Return in about 2 weeks (around 03/24/2021) for low OB, in person.  Future Appointments  Date Time Provider Department Center  03/14/2021  1:45 PM [facility] NURSE [facility]  03/14/2021  2:00 PM [facility]    Conan Bowens, MD

## 2021-03-10 NOTE — Progress Notes (Signed)
Pt reports fetal movement with some uterine irritability and occasional sharp pressure.

## 2021-03-14 ENCOUNTER — Ambulatory Visit: Payer: 59 | Admitting: *Deleted

## 2021-03-14 ENCOUNTER — Ambulatory Visit: Payer: 59 | Attending: Obstetrics and Gynecology

## 2021-03-14 ENCOUNTER — Other Ambulatory Visit: Payer: Self-pay

## 2021-03-14 VITALS — BP 124/76 | HR 113

## 2021-03-14 DIAGNOSIS — Z3402 Encounter for supervision of normal first pregnancy, second trimester: Secondary | ICD-10-CM | POA: Insufficient documentation

## 2021-03-14 DIAGNOSIS — Q632 Ectopic kidney: Secondary | ICD-10-CM | POA: Diagnosis present

## 2021-03-14 DIAGNOSIS — O35EXX0 Maternal care for other (suspected) fetal abnormality and damage, fetal genitourinary anomalies, not applicable or unspecified: Secondary | ICD-10-CM

## 2021-03-14 DIAGNOSIS — Z3A35 35 weeks gestation of pregnancy: Secondary | ICD-10-CM

## 2021-03-14 DIAGNOSIS — O358XX0 Maternal care for other (suspected) fetal abnormality and damage, not applicable or unspecified: Secondary | ICD-10-CM | POA: Insufficient documentation

## 2021-03-14 DIAGNOSIS — O359XX0 Maternal care for (suspected) fetal abnormality and damage, unspecified, not applicable or unspecified: Secondary | ICD-10-CM | POA: Diagnosis not present

## 2021-03-14 DIAGNOSIS — Z363 Encounter for antenatal screening for malformations: Secondary | ICD-10-CM

## 2021-03-15 ENCOUNTER — Telehealth: Payer: Self-pay

## 2021-03-15 ENCOUNTER — Encounter (HOSPITAL_COMMUNITY): Payer: Self-pay | Admitting: Obstetrics & Gynecology

## 2021-03-15 ENCOUNTER — Inpatient Hospital Stay (HOSPITAL_BASED_OUTPATIENT_CLINIC_OR_DEPARTMENT_OTHER): Payer: 59

## 2021-03-15 ENCOUNTER — Inpatient Hospital Stay (HOSPITAL_COMMUNITY)
Admission: AD | Admit: 2021-03-15 | Discharge: 2021-03-15 | Disposition: A | Discharge status: Home / Self Care | Payer: 59 | Attending: Obstetrics & Gynecology | Admitting: Obstetrics & Gynecology

## 2021-03-15 DIAGNOSIS — Z3689 Encounter for other specified antenatal screening: Secondary | ICD-10-CM | POA: Diagnosis not present

## 2021-03-15 DIAGNOSIS — Z79899 Other long term (current) drug therapy: Secondary | ICD-10-CM | POA: Diagnosis not present

## 2021-03-15 DIAGNOSIS — O368130 Decreased fetal movements, third trimester, not applicable or unspecified: Secondary | ICD-10-CM

## 2021-03-15 DIAGNOSIS — Z3A35 35 weeks gestation of pregnancy: Secondary | ICD-10-CM

## 2021-03-15 DIAGNOSIS — Z3402 Encounter for supervision of normal first pregnancy, second trimester: Secondary | ICD-10-CM

## 2021-03-15 DIAGNOSIS — Q632 Ectopic kidney: Secondary | ICD-10-CM

## 2021-03-15 DIAGNOSIS — O26833 Pregnancy related renal disease, third trimester: Secondary | ICD-10-CM

## 2021-03-15 DIAGNOSIS — Z8616 Personal history of COVID-19: Secondary | ICD-10-CM | POA: Diagnosis not present

## 2021-03-15 DIAGNOSIS — N2889 Other specified disorders of kidney and ureter: Secondary | ICD-10-CM | POA: Diagnosis not present

## 2021-03-15 DIAGNOSIS — O368190 Decreased fetal movements, unspecified trimester, not applicable or unspecified: Secondary | ICD-10-CM

## 2021-03-15 NOTE — Progress Notes (Signed)
Donette Larry CNM in earlier to discuss u/s results and d/c plan. Written and verbal d/c instructions given and understanding voiced.

## 2021-03-15 NOTE — MAU Note (Addendum)
Pt has felt FM today but just not as much as usual. Denies any pain or VB or d/c. EMS came out to her house last night for low b/p reading she had obtained at home. Baby moved while pt was in Triage and pt felt FM

## 2021-03-15 NOTE — Telephone Encounter (Signed)
Pt called states B/P has been low and decreased FM Pt states she ate breakfast around 11 am going into work was not giving a break and now eating 2nd meal after 4pm pt notes good water intake and has had FM since being home.  Pt sent Fetal kick count in Mychart info advised to monitor if no improvement may have  go to MAU this evening since pt call is being returned at 4:30pm  Pt noted dizziness at times. Pt advised shld be eating 6-8 small meals. May try something sweet in moderation Advised If baby is ok tonight and movement is fine and has decreased FM again tomorrow etc  Pt advised to contact office for possible work in for NST.  Pt voiced understanding.

## 2021-03-15 NOTE — MAU Provider Note (Addendum)
History     CSN: [id number]  Arrival date and time: 03/15/21 1927   Event Date/Time   First Provider Initiated Contact with Patient 03/15/21 2002      Chief Complaint  Patient presents with   Decreased Fetal Movement   30 y.o. G1 @35 .3 wks presenting with decreased FM today. She has felt some FM but not what she is used to. She is eating and drinking but admits to less water intake while at work today. She reports an episode of low blood pressure last night, called EMS and had normal evaluation.   OB History     Gravida  1   Para  0   Term  0   Preterm  0   AB  0   Living         SAB  0   IAB  0   Ectopic  0   Multiple      Live Births              Past Medical History:  Diagnosis Date   Anxiety 08/2019   COVID-19    Depression 08/2019   Seasonal allergies     Past Surgical History:  Procedure Laterality Date   APPENDECTOMY  01/2019    Family History  Problem Relation Age of Onset   Anxiety disorder Mother    Hypertension Father     Social History   Tobacco Use   Smoking status: Never   Smokeless tobacco: Never  Vaping Use   Vaping Use: Never used  Substance Use Topics   Alcohol use: Not Currently    Comment: last drink about a year ago   Drug use: No    Allergies:  Allergies  Allergen Reactions   Amoxicillin Swelling    Throat swells     Medications Prior to Admission  Medication Sig Dispense Refill Last Dose   cyclobenzaprine (FLEXERIL) 10 MG tablet Take 1 tablet (10 mg total) by mouth 2 (two) times daily as needed for muscle spasms. 20 tablet 0 Past Month   Prenatal Multivit-Min-Fe-FA (PRE-NATAL FORMULA) TABS Take one daily. 30 tablet 2 03/14/2021   promethazine (PHENERGAN) 25 MG tablet Take 1 tablet (25 mg total) by mouth every 6 (six) hours as needed for nausea or vomiting. 30 tablet 1 Past Month   terconazole (TERAZOL 7) 0.4 % vaginal cream Place 1 applicator vaginally at bedtime. (Patient not taking: No sig reported)  45 g 0     Review of Systems  Gastrointestinal:  Negative for abdominal pain.  Genitourinary:  Negative for vaginal bleeding.  Physical Exam   Blood pressure 120/77, pulse (!) 109, temperature 98.1 F (36.7 C), resp. rate 17, height 4\' 11"  (1.499 m), weight 68.5 kg, last menstrual period 07/05/2020.  Physical Exam Vitals and nursing note reviewed.  Constitutional:      General: She is not in acute distress.    Appearance: Normal appearance.  HENT:     Head: Normocephalic and atraumatic.  Cardiovascular:     Pulses: Normal pulses.     Heart sounds: No murmur heard. Pulmonary:     Effort: Pulmonary effort is normal. No respiratory distress.  Abdominal:     Palpations: Abdomen is soft.     Tenderness: There is no abdominal tenderness.  Musculoskeletal:        General: Normal range of motion.  Skin:    General: Skin is warm and dry.  Neurological:     General: No focal deficit present.  Mental Status: She is alert and oriented to person, place, and time.  Psychiatric:        Mood and Affect: Mood is anxious.        Behavior: Behavior normal.   EFM: 140 bpm, mod variability, + accels, no decels Toco: UI  MAU Course  Procedures  MDM BPP ordered and reviewed. BPP 8/8>10/10, AFI 9. Pt reports more FM now, describes as "flutters". Pt reassured of results. Stable for discharge home.  Assessment and Plan   1. Encounter for supervision of normal first pregnancy in second trimester   2. Pelvic kidney   3. Decreased fetal movement   4. [redacted] weeks gestation of pregnancy   5. NST (non-stress test) reactive    Discharge home Follow up at [facility] as scheduled [facility]  Allergies as of 03/15/2021       Reactions   Amoxicillin Swelling   Throat swells        Medication List     STOP taking these medications    terconazole 0.4 % vaginal cream Commonly known as: Terazol 7       TAKE these medications    cyclobenzaprine 10 MG tablet Commonly known as:  FLEXERIL Take 1 tablet (10 mg total) by mouth 2 (two) times daily as needed for muscle spasms.   Pre-Natal Formula Tabs Take one daily.   promethazine 25 MG tablet Commonly known as: PHENERGAN Take 1 tablet (25 mg total) by mouth every 6 (six) hours as needed for nausea or vomiting.        Donette Larry, CNM 03/15/2021, 9:50 PM

## 2021-03-15 NOTE — MAU Note (Signed)
When pt returned from u/s asked if she was feeling more FM. States she could feel "flutters" in u/s. Sonographer told pt at one point baby made a complete flip and pt states she felt it like a flutter

## 2021-03-24 ENCOUNTER — Other Ambulatory Visit: Payer: Self-pay

## 2021-03-24 ENCOUNTER — Ambulatory Visit (INDEPENDENT_AMBULATORY_CARE_PROVIDER_SITE_OTHER): Payer: 59

## 2021-03-24 ENCOUNTER — Other Ambulatory Visit (HOSPITAL_COMMUNITY)
Admission: RE | Admit: 2021-03-24 | Discharge: 2021-03-24 | Disposition: A | Discharge status: Home / Self Care | Payer: 59 | Source: Ambulatory Visit

## 2021-03-24 VITALS — BP 122/81 | HR 97 | Wt 152.0 lb

## 2021-03-24 DIAGNOSIS — Z3A36 36 weeks gestation of pregnancy: Secondary | ICD-10-CM

## 2021-03-24 DIAGNOSIS — Z3403 Encounter for supervision of normal first pregnancy, third trimester: Secondary | ICD-10-CM | POA: Diagnosis present

## 2021-03-24 NOTE — Progress Notes (Signed)
.     LOW-RISK PREGNANCY OFFICE VISIT  Patient name: Claudia Davis MRN [id number]  Date of birth: Jun 03, 1991 Chief Complaint:   Routine Prenatal Visit  Subjective:   Claudia Davis is a 30 y.o. G54P0000 female at [redacted]w[redacted]d with an Estimated Date of Delivery: 04/16/21 being seen today for ongoing management of a low-risk pregnancy aeb has Stress and adjustment reaction; Depression with anxiety; Encounter for supervision of normal first pregnancy in second trimester; Pelvic kidney; Seasonal allergies; Depression; and Anxiety on their problem list.  Patient presents today with nausea.  She states nausea is worse than in 1st trimester and occassionally responsive to medications.   Patient endorses fetal movement. Patient denies abdominal cramping or contractions.  Patient denies vaginal concerns including abnormal discharge, leaking of fluid, and bleeding.  She reports some constipation, but denies issues with urination. She reports she has had headaches twice in the past couple of weeks which is usually associated with nausea or low blood pressure. She expresses concern with edema of hands and feet.  Contractions: Irregular. Vag. Bleeding: None.  Movement: Present.  Reviewed past medical,surgical, social, obstetrical and family history as well as problem list, medications and allergies.  Objective   Vitals:   03/24/21 1438  BP: 122/81  Pulse: 97  Weight: 152 lb (68.9 kg)  Body mass index is 30.7 kg/m.  Total Weight Gain:9 lb (4.082 kg)         Physical Examination:   General appearance: Well appearing, and in no distress  Mental status: Alert, oriented to person, place, and time  Skin: Warm & dry  Cardiovascular: Normal heart rate noted  Respiratory: Normal respiratory effort, no distress  Abdomen: Soft, gravid, nontender, AGA with Fundal Height: 37 cm  Pelvic: Cervical exam performed  Dilation: Closed Effacement (%): Thick Station: Ballotable Presentation: Undeterminable  Extremities:  Edema: None  Fetal Status: Fetal Heart Rate (bpm): 136  Movement: Present   No results found for this or any previous visit (from the past 24 hour(s)).  Assessment & Plan:  Low-risk pregnancy of a 30 y.o., G1P0000 at [redacted]w[redacted]d with an Estimated Date of Delivery: 04/16/21   1. Encounter for supervision of normal first pregnancy in third trimester -Anticipatory guidance for upcoming appts. -Patient to schedule next appt in 1 weeks for an in-person visit. -Labs as below. -Educated on GBS bacteria including what it is, why we test, and how and when we treat if needed. -Discussed releasing of results to mychart.   2. [redacted] weeks gestation of pregnancy -Doing Well -Reviewed labor onset including contraction pattern and when to contact office. -Encouraged increased hydration and elevation of feet after work.  -Reassured of normalcy of return of nausea in 3rd trimester. -Instructed to continue to monitor HA and report.      Meds: No orders of the defined types were placed in this encounter.  Labs/procedures today:  Lab Orders         Culture, beta strep (group b only)       Reviewed: Preterm labor symptoms and general obstetric precautions including but not limited to vaginal bleeding, contractions, leaking of fluid and fetal movement were reviewed in detail with the patient.  All questions were answered.  Follow-up: Return in about 1 week (around 03/31/2021) for LROB.  Orders Placed This Encounter  Procedures   Culture, beta strep (group b only)   Cherre Robins MSN, CNM 03/24/2021

## 2021-03-24 NOTE — Addendum Note (Signed)
Addended by: Natale Milch D on: 03/24/2021 04:59 PM   Modules accepted: Orders

## 2021-03-28 LAB — CERVICOVAGINAL ANCILLARY ONLY
Chlamydia: NEGATIVE
Comment: NEGATIVE
Comment: NORMAL
Neisseria Gonorrhea: NEGATIVE

## 2021-03-28 LAB — CULTURE, BETA STREP (GROUP B ONLY): Strep Gp B Culture: NEGATIVE

## 2021-03-31 ENCOUNTER — Other Ambulatory Visit: Payer: Self-pay

## 2021-03-31 ENCOUNTER — Encounter: Payer: Self-pay | Admitting: Obstetrics and Gynecology

## 2021-03-31 ENCOUNTER — Ambulatory Visit (INDEPENDENT_AMBULATORY_CARE_PROVIDER_SITE_OTHER): Payer: 59 | Admitting: Obstetrics and Gynecology

## 2021-03-31 VITALS — BP 122/81 | HR 109 | Wt 151.7 lb

## 2021-03-31 DIAGNOSIS — Z3402 Encounter for supervision of normal first pregnancy, second trimester: Secondary | ICD-10-CM

## 2021-03-31 NOTE — Progress Notes (Signed)
Subjective:  Claudia Davis is a 30 y.o. G1P0000 at [redacted]w[redacted]d being seen today for ongoing prenatal care.  She is currently monitored for the following issues for this low-risk pregnancy and has Stress and adjustment reaction; Depression with anxiety; Encounter for supervision of normal first pregnancy in second trimester; Pelvic kidney; Seasonal allergies; Depression; and Anxiety on their problem list.  Patient reports general discomforts of pregnancy.  Contractions: Irregular.  .  Movement: Present. Denies leaking of fluid.   The following portions of the patient's history were reviewed and updated as appropriate: allergies, current medications, past family history, past medical history, past social history, past surgical history and problem list. Problem list updated.  Objective:   Vitals:   03/31/21 1522  BP: 122/81  Pulse: (!) 109  Weight: 151 lb 11.2 oz (68.8 kg)    Fetal Status: Fetal Heart Rate (bpm): 148   Movement: Present     General:  Alert, oriented and cooperative. Patient is in no acute distress.  Skin: Skin is warm and dry. No rash noted.   Cardiovascular: Normal heart rate noted  Respiratory: Normal respiratory effort, no problems with respiration noted  Abdomen: Soft, gravid, appropriate for gestational age. Pain/Pressure: Present     Pelvic:  Cervical exam deferred        Extremities: Normal range of motion.  Edema: Trace  Mental Status: Normal mood and affect. Normal behavior. Normal judgment and thought content.   Urinalysis:      Assessment and Plan:  Pregnancy: G1P0000 at [redacted]w[redacted]d  1. Encounter for supervision of normal first pregnancy in second trimester Labor precautions  Term labor symptoms and general obstetric precautions including but not limited to vaginal bleeding, contractions, leaking of fluid and fetal movement were reviewed in detail with the patient. Please refer to After Visit Summary for other counseling recommendations.  Return in about 1 week  (around 04/07/2021) for OB visit, face to face, any provider.   Hermina Staggers, MD

## 2021-03-31 NOTE — Progress Notes (Signed)
Pt reports fetal movement with irregular contractions. 

## 2021-03-31 NOTE — Patient Instructions (Signed)
Vaginal Delivery ?Vaginal delivery means that you give birth by pushing your baby out of your birth canal (vagina). Your health care team will help you before, during, and after vaginal delivery. ?Birth experiences are unique for every woman and every pregnancy, and birth experiences vary depending on where you choose to give birth. ?What are the risks and benefits? ?Generally, this is safe. However, problems may occur, including: ?Bleeding. ?Infection. ?Damage to other structures such as vaginal tearing. ?Allergic reactions to medicines. ?Despite the risks, benefits of vaginal delivery include less risk of bleeding and infection and a shorter recovery time compared to a Cesarean delivery. Cesarean delivery, or C-section, is the surgical delivery of a baby. ?What happens when I arrive at the birth center or hospital? ?Once you are in labor and have been admitted into the hospital or birth center, your health care team may: ?Review your pregnancy history and any concerns that you have. ?Talk with you about your birth plan and discuss pain control options. ?Check your blood pressure, breathing, and heartbeat. ?Assess your baby's heartbeat. ?Monitor your uterus for contractions. ?Check whether your bag of water (amniotic sac) has broken (ruptured). ?Insert an IV into one of your veins. This may be used to give you fluids and medicines. ?Monitoring ?Your health care team may assess your contractions (uterine monitoring) and your baby's heart rate (fetal monitoring). You may need to be monitored: ?Often, but not continuously (intermittently). ?All the time or for long periods at a time (continuously). Continuous monitoring may be needed if: ?You are taking certain medicines, such as medicine to relieve pain or make your contractions stronger. ?You have pregnancy or labor complications. ?Monitoring may be done by: ?Placing a special stethoscope or a handheld monitoring device on your abdomen to check your baby's heartbeat  and to check for contractions. ?Placing monitors on your abdomen (external monitors) to record your baby's heartbeat and the frequency and length of contractions. ?Placing monitors inside your uterus through your vagina (internal monitors) to record your baby's heartbeat and the frequency, length, and strength of your contractions. Depending on the type of monitor, it may remain in your uterus or on your baby's head until birth. ?Telemetry. This is a type of continuous monitoring that can be done with external or internal monitors. Instead of having to stay in bed, you are able to move around. ?Physical exam ?Your health care team may perform frequent physical exams. This may include: ?Checking how and where your baby is positioned in your uterus. ?Checking your cervix to determine: ?Whether it is thinning out (effacing). ?Whether it is opening up (dilating). ?What happens during labor and delivery? ?Normal labor and delivery is divided into the following three stages: ?Stage 1 ?This is the longest stage of labor. ?Throughout this stage, you will feel contractions. Contractions generally feel mild, infrequent, and irregular at first. They get stronger, more frequent, and more regular as you move through this stage. You may have contractions about every 2-3 minutes. ?This stage ends when your cervix is completely dilated to 4 inches (10 cm) and completely effaced. ?Stage 2 ?This stage starts once your cervix is completely effaced and dilated and lasts until the delivery of your baby. ?This is the stage where you will feel an urge to push your baby out of your vagina. ?You may feel stretching and burning pain, especially when the widest part of your baby's head passes through the vaginal opening (crowning). ?Once your baby is delivered, the umbilical cord will be clamped and   cut. Timing of cutting the cord will depend on your wishes, your baby's health, and your health care provider's practices. ?Your baby will be  placed on your bare chest (skin-to-skin contact) in an upright position and covered with a warm blanket. If you are choosing to breastfeed, watch your baby for feeding cues, like rooting or sucking, and help the baby to your breast for his or her first feeding. ?Stage 3 ?This stage starts immediately after the birth of your baby and ends after you deliver the placenta. ?This stage may take anywhere from 5 to 30 minutes. ?After your baby has been delivered, you will feel contractions as your body expels the placenta. These contractions also help your uterus get smaller and reduce bleeding. ?What can I expect after labor and delivery? ?After labor is over, you and your baby will be assessed closely until you are ready to go home. Your health care team will teach you how to care for yourself and your baby. ?You and your baby may be encouraged to stay in the same room (rooming in) during your hospital stay. This will help promote early bonding and successful breastfeeding. ?Your uterus will be checked and massaged regularly (fundal massage). ?You may continue to receive fluids and medicines through an IV. ?You will have some soreness and pain in your abdomen, vagina, and the area of skin between your vaginal opening and your anus (perineum). ?If an incision was made near your vagina (episiotomy) or if you had some vaginal tearing during delivery, cold compresses may be placed on your episiotomy or your tear. This helps to reduce pain and swelling. ?It is normal to have vaginal bleeding after delivery. Wear a sanitary pad for vaginal bleeding and discharge. ?Summary ?Vaginal delivery means that you will give birth by pushing your baby out of your birth canal (vagina). ?Your health care team will monitor you and your baby throughout the stages of labor. ?After you deliver your baby, your health care team will continue to assess you and your baby to ensure you are both recovering as expected after delivery. ?This  information is not intended to replace advice given to you by your health care provider. Make sure you discuss any questions you have with your health care provider. ?Document Revised: 06/14/2020 Document Reviewed: 06/14/2020 ?Elsevier Patient Education ? 2022 [employer] ? ?

## 2021-04-07 ENCOUNTER — Other Ambulatory Visit: Payer: Self-pay

## 2021-04-07 ENCOUNTER — Ambulatory Visit (INDEPENDENT_AMBULATORY_CARE_PROVIDER_SITE_OTHER): Payer: Self-pay | Admitting: Obstetrics and Gynecology

## 2021-04-07 DIAGNOSIS — Z3402 Encounter for supervision of normal first pregnancy, second trimester: Secondary | ICD-10-CM

## 2021-04-07 NOTE — Progress Notes (Signed)
Pt reports fetal movement with irregular contractions and pressure. Pt states that she has been feeling 10 kicks/day but movement has decreased from the usual activity.

## 2021-04-07 NOTE — Progress Notes (Signed)
   PRENATAL VISIT NOTE  Subjective:  Claudia Davis is a 30 y.o. G1P0000 at [redacted]w[redacted]d being seen today for ongoing prenatal care.  She is currently monitored for the following issues for this low-risk pregnancy and has Stress and adjustment reaction; Depression with anxiety; Encounter for supervision of normal first pregnancy in second trimester; Pelvic kidney; Seasonal allergies; Depression; and Anxiety on their problem list.  Patient reports no complaints.  Contractions: Irregular. Vag. Bleeding: None.  Movement: Present. Denies leaking of fluid.   The following portions of the patient's history were reviewed and updated as appropriate: allergies, current medications, past family history, past medical history, past social history, past surgical history and problem list.   Objective:   Vitals:   04/07/21 1110  BP: 134/87  Pulse: 93  Weight: 153 lb (69.4 kg)    Fetal Status: Fetal Heart Rate (bpm): 132   Movement: Present     General:  Alert, oriented and cooperative. Patient is in no acute distress.  Skin: Skin is warm and dry. No rash noted.   Cardiovascular: Normal heart rate noted  Respiratory: Normal respiratory effort, no problems with respiration noted  Abdomen: Soft, gravid, appropriate for gestational age.  Pain/Pressure: Present     Pelvic: Cervical exam deferred        Extremities: Normal range of motion.  Edema: Trace  Mental Status: Normal mood and affect. Normal behavior. Normal judgment and thought content.   Assessment and Plan:  Pregnancy: G1P0000 at [redacted]w[redacted]d 1. Encounter for supervision of normal first pregnancy in second trimester  Doing well, starting to have some painful contractions at night. GBS negative Briefly discussed induction @ 41 weeks if needed.  She reports changes in movements, although not concerned. Baby is moving.   Term labor symptoms and general obstetric precautions including but not limited to vaginal bleeding, contractions, leaking of fluid  and fetal movement were reviewed in detail with the patient. Please refer to After Visit Summary for other counseling recommendations.   Return in about 1 week (around 04/14/2021).  Future Appointments  Date Time Provider Department Center  04/13/2021  1:30 PM Warden Fillers, MD [facility] None    Venia Carbon, NP

## 2021-04-09 ENCOUNTER — Inpatient Hospital Stay (HOSPITAL_COMMUNITY): Payer: Federal, State, Local not specified - PPO | Admitting: Anesthesiology

## 2021-04-09 ENCOUNTER — Inpatient Hospital Stay (HOSPITAL_COMMUNITY)
Admission: AD | Admit: 2021-04-09 | Discharge: 2021-04-12 | DRG: 805 | Disposition: A | Discharge status: Home / Self Care | Payer: Federal, State, Local not specified - PPO | Attending: Obstetrics & Gynecology | Admitting: Obstetrics & Gynecology

## 2021-04-09 ENCOUNTER — Inpatient Hospital Stay (HOSPITAL_COMMUNITY)
Admission: AD | Admit: 2021-04-09 | Discharge: 2021-04-09 | Disposition: A | Discharge status: Home / Self Care | Payer: Federal, State, Local not specified - PPO | Attending: Family Medicine | Admitting: Family Medicine

## 2021-04-09 ENCOUNTER — Other Ambulatory Visit: Payer: Self-pay

## 2021-04-09 ENCOUNTER — Encounter (HOSPITAL_COMMUNITY): Payer: Self-pay | Admitting: Family Medicine

## 2021-04-09 DIAGNOSIS — Z20822 Contact with and (suspected) exposure to covid-19: Secondary | ICD-10-CM | POA: Diagnosis present

## 2021-04-09 DIAGNOSIS — O471 False labor at or after 37 completed weeks of gestation: Secondary | ICD-10-CM | POA: Diagnosis not present

## 2021-04-09 DIAGNOSIS — O4593 Premature separation of placenta, unspecified, third trimester: Secondary | ICD-10-CM | POA: Diagnosis not present

## 2021-04-09 DIAGNOSIS — Z3A39 39 weeks gestation of pregnancy: Secondary | ICD-10-CM

## 2021-04-09 DIAGNOSIS — O42013 Preterm premature rupture of membranes, onset of labor within 24 hours of rupture, third trimester: Secondary | ICD-10-CM

## 2021-04-09 DIAGNOSIS — O4292 Full-term premature rupture of membranes, unspecified as to length of time between rupture and onset of labor: Principal | ICD-10-CM | POA: Diagnosis present

## 2021-04-09 DIAGNOSIS — F418 Other specified anxiety disorders: Secondary | ICD-10-CM | POA: Diagnosis present

## 2021-04-09 DIAGNOSIS — Z3480 Encounter for supervision of other normal pregnancy, unspecified trimester: Secondary | ICD-10-CM

## 2021-04-09 DIAGNOSIS — Z8616 Personal history of COVID-19: Secondary | ICD-10-CM

## 2021-04-09 DIAGNOSIS — O326XX0 Maternal care for compound presentation, not applicable or unspecified: Secondary | ICD-10-CM | POA: Diagnosis not present

## 2021-04-09 LAB — CBC
HCT: 40.4 % (ref 36.0–46.0)
Hemoglobin: 13.4 g/dL (ref 12.0–15.0)
MCH: 31.2 pg (ref 26.0–34.0)
MCHC: 33.2 g/dL (ref 30.0–36.0)
MCV: 94 fL (ref 80.0–100.0)
Platelets: 249 10*3/uL (ref 150–400)
RBC: 4.3 MIL/uL (ref 3.87–5.11)
RDW: 13.5 % (ref 11.5–15.5)
WBC: 16.6 10*3/uL — ABNORMAL HIGH (ref 4.0–10.5)
nRBC: 0 % (ref 0.0–0.2)

## 2021-04-09 LAB — RESP PANEL BY RT-PCR (FLU A&B, COVID) ARPGX2
Influenza A by PCR: NEGATIVE
Influenza B by PCR: NEGATIVE
SARS Coronavirus 2 by RT PCR: NEGATIVE

## 2021-04-09 LAB — TYPE AND SCREEN
ABO/RH(D): A POS
Antibody Screen: NEGATIVE

## 2021-04-09 LAB — AMNISURE RUPTURE OF MEMBRANE (ROM) NOT AT ARMC: Amnisure ROM: POSITIVE

## 2021-04-09 LAB — POCT FERN TEST: POCT Fern Test: NEGATIVE

## 2021-04-09 MED ORDER — EPHEDRINE 5 MG/ML INJ: 10.0000 mg | INTRAVENOUS | Status: DC | PRN

## 2021-04-09 MED ORDER — SOD CITRATE-CITRIC ACID 500-334 MG/5ML PO SOLN: 30.0000 mL | ORAL | Status: DC | PRN

## 2021-04-09 MED ORDER — LACTATED RINGERS IV SOLN
500.0000 mL | Freq: Once | INTRAVENOUS | Status: AC
  Administered 2021-04-09: 500 mL via INTRAVENOUS

## 2021-04-09 MED ORDER — LIDOCAINE HCL (PF) 1 % IJ SOLN
INTRAMUSCULAR | Status: DC | PRN
  Administered 2021-04-09: 8 mL via EPIDURAL

## 2021-04-09 MED ORDER — DIPHENHYDRAMINE HCL 50 MG/ML IJ SOLN
12.5000 mg | INTRAMUSCULAR | Status: DC | PRN
Start: 2021-04-09 — End: 2021-04-10

## 2021-04-09 MED ORDER — PHENYLEPHRINE 40 MCG/ML (10ML) SYRINGE FOR IV PUSH (FOR BLOOD PRESSURE SUPPORT)
80.0000 ug | PREFILLED_SYRINGE | INTRAVENOUS | Status: AC | PRN
  Administered 2021-04-09 (×3): 80 ug via INTRAVENOUS

## 2021-04-09 MED ORDER — ACETAMINOPHEN 325 MG PO TABS: 650.0000 mg | ORAL_TABLET | ORAL | Status: DC | PRN

## 2021-04-09 MED ORDER — FENTANYL-BUPIVACAINE-NACL 0.5-0.125-0.9 MG/250ML-% EP SOLN
12.0000 mL/h | EPIDURAL | Status: DC | PRN
  Administered 2021-04-09: 12 mL/h via EPIDURAL
  Filled 2021-04-09: qty 250

## 2021-04-09 MED ORDER — OXYCODONE-ACETAMINOPHEN 5-325 MG PO TABS: 1.0000 | ORAL_TABLET | ORAL | Status: DC | PRN

## 2021-04-09 MED ORDER — OXYTOCIN-SODIUM CHLORIDE 30-0.9 UT/500ML-% IV SOLN
2.5000 [IU]/h | INTRAVENOUS | Status: DC
  Filled 2021-04-09: qty 500

## 2021-04-09 MED ORDER — FENTANYL-BUPIVACAINE-NACL 0.5-0.125-0.9 MG/250ML-% EP SOLN: 12.0000 mL/h | EPIDURAL | Status: DC | PRN

## 2021-04-09 MED ORDER — LIDOCAINE HCL (PF) 1 % IJ SOLN: 30.0000 mL | INTRAMUSCULAR | Status: DC | PRN

## 2021-04-09 MED ORDER — LACTATED RINGERS IV SOLN: 500.0000 mL | INTRAVENOUS | Status: DC | PRN

## 2021-04-09 MED ORDER — FENTANYL CITRATE (PF) 100 MCG/2ML IJ SOLN
100.0000 ug | INTRAMUSCULAR | Status: DC | PRN
  Administered 2021-04-09 – 2021-04-10 (×4): 100 ug via INTRAVENOUS
  Filled 2021-04-09 (×4): qty 2

## 2021-04-09 MED ORDER — ONDANSETRON HCL 4 MG/2ML IJ SOLN: 4.0000 mg | Freq: Four times a day (QID) | INTRAMUSCULAR | Status: DC | PRN

## 2021-04-09 MED ORDER — OXYTOCIN BOLUS FROM INFUSION
333.0000 mL | Freq: Once | INTRAVENOUS | Status: AC
  Administered 2021-04-10: 333 mL via INTRAVENOUS

## 2021-04-09 MED ORDER — DIPHENHYDRAMINE HCL 50 MG/ML IJ SOLN
12.5000 mg | INTRAMUSCULAR | Status: DC | PRN
Start: 2021-04-09 — End: 2021-04-09

## 2021-04-09 MED ORDER — LACTATED RINGERS IV SOLN: INTRAVENOUS | Status: DC

## 2021-04-09 MED ORDER — OXYCODONE-ACETAMINOPHEN 5-325 MG PO TABS: 2.0000 | ORAL_TABLET | ORAL | Status: DC | PRN

## 2021-04-09 MED ORDER — PHENYLEPHRINE 40 MCG/ML (10ML) SYRINGE FOR IV PUSH (FOR BLOOD PRESSURE SUPPORT)
80.0000 ug | PREFILLED_SYRINGE | INTRAVENOUS | Status: DC | PRN
  Filled 2021-04-09: qty 10

## 2021-04-09 NOTE — MAU Note (Signed)
Patient arrived to MAU lobby. Was taken straight to a room due to patient being in a significant amount of pain. Patient stated that she felt a gush of fluid around 1415. + FM reported. Patient significant other in attendance at bedside.

## 2021-04-09 NOTE — Anesthesia Procedure Notes (Signed)
Epidural Patient location during procedure: OB Start time: 04/09/2021 10:55 PM End time: 04/09/2021 11:00 PM  Preanesthetic Checklist Completed: patient identified, IV checked, site marked, risks and benefits discussed, surgical consent, monitors and equipment checked, pre-op evaluation and timeout performed  Epidural Patient position: sitting Prep: DuraPrep and site prepped and draped Patient monitoring: continuous pulse ox and blood pressure Approach: midline Location: L4-L5 Injection technique: LOR air  Needle:  Needle type: Tuohy  Needle gauge: 17 G Needle length: 9 cm and 9 Needle insertion depth: 7 cm Catheter type: closed end flexible Catheter size: 19 Gauge Catheter at skin depth: 13 cm Test dose: negative  Assessment Events: blood not aspirated, injection not painful, no injection resistance, no paresthesia and negative IV test

## 2021-04-09 NOTE — MAU Provider Note (Signed)
S: Ms. Claudia Davis is a 30 y.o. G1P0000 at [redacted]w[redacted]d  who presents to MAU today complaining contractions q 5 minutes since 0500. She denies vaginal bleeding. She denies LOF. She reports normal fetal movement.    O: BP 125/81 (BP Location: Right Arm)   Pulse 95   Temp 98.8 F (37.1 C) (Oral)   Resp 20   LMP 07/05/2020   SpO2 100%  GENERAL: Well-developed, well-nourished female in no acute distress.  HEAD: Normocephalic, atraumatic.  CHEST: Normal effort of breathing, regular heart rate ABDOMEN: Soft, nontender, gravid  Cervical exam:  Dilation: 1 Effacement (%): 80 Station: -2 Presentation: Vertex Exam by:: F. Morris, RNC   Fetal Monitoring: Baseline: 130 Variability: moderate Accelerations: 15x15 Decelerations: none Contractions: 5-10   A: SIUP at [redacted]w[redacted]d  False labor  P: -Discharge home in stable condition -Labor precautions discussed -Patient advised to follow-up with OB as scheduled for prenatal care -Patient may return to MAU as needed or if her condition were to change or worsen   Rolm Bookbinder, [place] 04/09/2021 9:18 AM

## 2021-04-09 NOTE — MAU Note (Signed)
Presents with c/o ctxs that are 5 minutes apart since 0500 this morning.  Denies VB or LOF.  Endorses +FM.

## 2021-04-09 NOTE — MAU Provider Note (Signed)
S: Ms. Claudia Davis is a 30 y.o. G1P0000 at [redacted]w[redacted]d  who presents to MAU today for labor evaluation. Patient reports contractions since 3am and possible ROM at 2:15pm with clear fluid. She denies vaginal bleeding. Endorses active fetal movement.    O:  Today's Vitals   04/09/21 1510 04/09/21 1555  BP: 128/81   Pulse: 96   Resp: 20   Temp: 97.6 F (36.4 C)   TempSrc: Oral   SpO2: 98%   PainSc:  9    There is no height or weight on file to calculate BMI. General: well developed, well-nourished female, mildly distressed Head: normocephalic, atraumatic Chest: normal effort of breathing, regular heart rate Abdomen: soft, nontender, gravid Pelvic: NEFG, negative pooling of amniotic fluid, copious amounts of clear, blood tinged mucoid discharge, no vaginal bleeding, cervix visually 1cm    Cervical exam by RN:  Dilation: 3 Effacement (%): 90 Cervical Position: Posterior Station: -2 Presentation: Vertex Exam by:: Camelia Eng, CNM  Fetal Monitoring: Baseline: 135 bpm Variability: moderate Accelerations: 15x15 present Decelerations: absent Contractions: Q 2-39mins  MDM RN asked CNM to do speculum exam for r/o ROM Pelvic exam negative for amniotic fluid, fern slide negative Cervix 1/80/-2 > 2/90/-2 > 3/90/-2 1645: patient called out again stating she felt a pop and gush of fluid come out. Fern slide negative x2.  Amnisure positive. NST reactive and reassuring   A: SIUP at [redacted]w[redacted]d  Spontaneous rupture of membranes   P: Admit to labor and delivery Labor team notified and to place admission orders    Camelia Eng, MSN, CNM 04/09/2021 7:17 PM

## 2021-04-09 NOTE — Anesthesia Preprocedure Evaluation (Signed)
Anesthesia Evaluation  Patient identified by MRN, date of birth, ID band Patient awake    Reviewed: Allergy & Precautions, H&P , NPO status , Patient's Chart, lab work & pertinent test results, reviewed documented beta blocker date and time   Airway Mallampati: II  TM Distance: >3 FB Neck ROM: full    Dental no notable dental hx. (+) Teeth Intact, Dental Advisory Given   Pulmonary neg pulmonary ROS,    Pulmonary exam normal breath sounds clear to auscultation       Cardiovascular negative cardio ROS Normal cardiovascular exam Rhythm:regular Rate:Normal     Neuro/Psych PSYCHIATRIC DISORDERS Anxiety Depression negative neurological ROS     GI/Hepatic negative GI ROS, Neg liver ROS,   Endo/Other  negative endocrine ROS  Renal/GU negative Renal ROS  negative genitourinary   Musculoskeletal   Abdominal   Peds  Hematology negative hematology ROS (+)   Anesthesia Other Findings   Reproductive/Obstetrics (+) Pregnancy                             Anesthesia Physical Anesthesia Plan  ASA: 2  Anesthesia Plan: Epidural   Post-op Pain Management:    Induction:   PONV Risk Score and Plan: Treatment may vary due to age or medical condition  Airway Management Planned: Natural Airway  Additional Equipment: None  Intra-op Plan:   Post-operative Plan:   Informed Consent: I have reviewed the patients History and Physical, chart, labs and discussed the procedure including the risks, benefits and alternatives for the proposed anesthesia with the patient or authorized representative who has indicated his/her understanding and acceptance.       Plan Discussed with: Anesthesiologist  Anesthesia Plan Comments:         Anesthesia Quick Evaluation

## 2021-04-09 NOTE — H&P (Signed)
OBSTETRIC ADMISSION HISTORY AND PHYSICAL  Claudia Davis is a 30 y.o. female G1P0000 with IUP at [redacted]w[redacted]d by [place] presenting for PROM (positive amnisure). She reports +FMs, No LOF, no VB, no blurry vision, headaches or peripheral edema, and RUQ pain.  She plans on breast feeding. She request no birth control. She received her prenatal care at  [facility]    Dating: By 9.4 wks U/S--->  Estimated Date of Delivery: 04/16/21  Sono:   @[redacted]w[redacted]d , CWD, normal anatomy with LT pelvic kidney, cephalic presentation, 2436g, EFW   Prenatal History/Complications: H/O Anxiety and Depression  OB Box:  Nursing Staff Provider  Office Location [facility] Dating  9.4 wks U/S  Language  English Anatomy 30%  LT Pelvic kidney   Flu Vaccine  Defer until after 12 weeks Genetic Screen  NIPS:   AFP: Negative  First Screen:  Quad:  Horizon: Negative  TDaP Vaccine  01/13/21 Hgb A1C or  GTT Early  Third trimester normal 82-58-73  COVID Vaccine Not vaccinated   LAB RESULTS   Rhogam  N/A Blood Type A/Positive/-- (02/24 1623)   Feeding Plan Breast Antibody Negative (02/24 1623)  Contraception Declined Rubella 1.75 (02/24 1623) IMMUNE  Circumcision Yes if a boy RPR Non Reactive (06/16 1121)   Pediatrician  Undecided HBsAg Negative (02/24 1623)   Support Person Darren-Husband HCVAb neg  Prenatal Classes Information given.  HIV Non Reactive (06/16 1121)     BTL Consent N/A GBS Negative/-- (08/25 0316) Negative  VBAC Consent N/A Pap Negative 08/2020    Hgb Electro  normal  BP Cuff Given 09/15/20 CF neg  PHQ-9/GAD-7   [ ]  28 weeks  [ ]  36 weeks SMA neg    Waterbirth    Induction  [ ]  Orders Entered [ ] Foley Y/N    Past Medical History: Past Medical History:  Diagnosis Date   Anxiety 08/2019   COVID-19    Depression 08/2019   Seasonal allergies     Past Surgical History: Past Surgical History:  Procedure Laterality Date   APPENDECTOMY  01/2019    Obstetrical History: OB History     Gravida  1   Para  0    Term  0   Preterm  0   AB  0   Living         SAB  0   IAB  0   Ectopic  0   Multiple      Live Births              Social History Social History   Socioeconomic History   Marital status: Married    Spouse name: Not on file   Number of children: Not on file   Years of education: Not on file   Highest education level: Not on file  Occupational History   Not on file  Tobacco Use   Smoking status: Never   Smokeless tobacco: Never  Vaping Use   Vaping Use: Never used  Substance and Sexual Activity   Alcohol use: Not Currently    Comment: last drink about a year ago   Drug use: No   Sexual activity: Yes    Partners: Male    Birth control/protection: None  Other Topics Concern   Not on file  Social History Narrative   ** Merged History Encounter **       Social Determinants of Health   Financial Resource Strain: Not on file  Food Insecurity: Not on file  Transportation Needs:  Not on file  Physical Activity: Not on file  Stress: Not on file  Social Connections: Not on file    Family History: Family History  Problem Relation Age of Onset   Anxiety disorder Mother    Hypertension Father     Allergies: Allergies  Allergen Reactions   Amoxicillin Swelling    Throat swells     Medications Prior to Admission  Medication Sig Dispense Refill Last Dose   cyclobenzaprine (FLEXERIL) 10 MG tablet Take 1 tablet (10 mg total) by mouth 2 (two) times daily as needed for muscle spasms. 20 tablet 0    Prenatal Multivit-Min-Fe-FA (PRE-NATAL FORMULA) TABS Take one daily. 30 tablet 2    promethazine (PHENERGAN) 25 MG tablet Take 1 tablet (25 mg total) by mouth every 6 (six) hours as needed for nausea or vomiting. 30 tablet 1      Review of Systems   All systems reviewed and negative except as stated in HPI  Blood pressure 128/81, pulse 96, temperature 97.6 F (36.4 C), temperature source Oral, resp. rate 20, height 4\' 11"  (1.499 m), weight 69.4 kg,  last menstrual period 07/05/2020, SpO2 98 %. General appearance: alert, cooperative, and mild distress Lungs: clear to auscultation bilaterally Heart: regular rate and rhythm Abdomen: soft, non-tender; bowel sounds normal Pelvic: adequate Extremities: Homans sign is negative, no sign of DVT DTR's normal Presentation: cephalic Fetal monitoringBaseline: 130 bpm, Variability: Fair (1-6 bpm), Accelerations: 10 x 10 accels present, and Decelerations: Absent Uterine activity: regular every 2-4 minutes Dilation: 4.5 Effacement (%): 90 Station: -1 Exam by:: [email or web address], RN   Prenatal labs: ABO, Rh: --/--/A POS (09/10 1915) Antibody: NEG (09/10 1915) Rubella: 1.75 (02/24 1623) IMMUNE RPR: Non Reactive (06/16 1121)  HBsAg: Negative (02/24 1623)  HIV: Non Reactive (06/16 1121)  GBS: Negative/-- (08/25 0316)  2 hr GTT Normal 82-58-73 Genetic screening  Normal Anatomy 11-09-1989 Normal with LT pelvic kidney  Prenatal Transfer Tool  Maternal Diabetes: No Genetic Screening: Normal Maternal Ultrasounds/Referrals: Fetal Kidney Anomalies - LT pelvic kidney Fetal Ultrasounds or other Referrals:  None Maternal Substance Abuse:  No Significant Maternal Medications:  None Significant Maternal Lab Results: Group B Strep negative  Results for orders placed or performed during the hospital encounter of 04/09/21 (from the past 24 hour(s))  Fern Test   Collection Time: 04/09/21  3:33 PM  Result Value Ref Range   POCT Fern Test Negative = intact amniotic membranes   Amnisure rupture of membrane (rom)not at [facility]   Collection Time: 04/09/21  5:02 PM  Result Value Ref Range   Amnisure ROM POSITIVE   Resp Panel by RT-PCR (Flu A&B, Covid) Nasopharyngeal Swab   Collection Time: 04/09/21  7:15 PM   Specimen: Nasopharyngeal Swab; Nasopharyngeal(NP) swabs in vial transport medium  Result Value Ref Range   SARS Coronavirus 2 by RT PCR NEGATIVE NEGATIVE   Influenza A by PCR NEGATIVE NEGATIVE   Influenza B  by PCR NEGATIVE NEGATIVE  Type and screen [facility]   Collection Time: 04/09/21  7:15 PM  Result Value Ref Range   ABO/RH(D) A POS    Antibody Screen NEG    Sample Expiration      04/12/2021,2359 Performed at [facility], [address], [place], [place]   CBC   Collection Time: 04/09/21  7:37 PM  Result Value Ref Range   WBC 16.6 (H) 4.0 - 10.5 K/uL   RBC 4.30 3.87 - 5.11 MIL/uL   Hemoglobin 13.4  12.0 - 15.0 g/dL   HCT 32.9 92.4 - 26.8 %   MCV 94.0 80.0 - 100.0 fL   MCH 31.2 26.0 - 34.0 pg   MCHC 33.2 30.0 - 36.0 g/dL   RDW 34.1 96.2 - 22.9 %   Platelets 249 150 - 400 K/uL   nRBC 0.0 0.0 - 0.2 %    Patient Active Problem List   Diagnosis Date Noted   Supervision of other normal pregnancy, antepartum 04/09/2021   Seasonal allergies    Pelvic kidney 11/20/2020   Encounter for supervision of normal first pregnancy in second trimester 09/15/2020   Depression with anxiety 11/20/2019   Stress and adjustment reaction 10/06/2019   Depression 08/2019   Anxiety 08/2019    Assessment/Plan:  Claudia Davis is a 30 y.o. G1P0000 at [redacted]w[redacted]d here for PROM.  #Labor:Active #Pain: Planning epidural #FWB: Category 1  #ID:  GBS Neg #MOF: Breast #MOC:none #Circ:  yes  Raelyn Mora, CNM  04/09/2021, 10:33 PM

## 2021-04-09 NOTE — Discharge Instructions (Signed)

## 2021-04-10 DIAGNOSIS — O326XX0 Maternal care for compound presentation, not applicable or unspecified: Secondary | ICD-10-CM

## 2021-04-10 DIAGNOSIS — Z3A39 39 weeks gestation of pregnancy: Secondary | ICD-10-CM

## 2021-04-10 LAB — RPR: RPR Ser Ql: NONREACTIVE

## 2021-04-10 MED ORDER — ONDANSETRON HCL 4 MG/2ML IJ SOLN: 4.0000 mg | INTRAMUSCULAR | Status: DC | PRN

## 2021-04-10 MED ORDER — ACETAMINOPHEN 325 MG PO TABS
650.0000 mg | ORAL_TABLET | ORAL | Status: DC | PRN
  Filled 2021-04-10: qty 2

## 2021-04-10 MED ORDER — TETANUS-DIPHTH-ACELL PERTUSSIS 5-2.5-18.5 LF-MCG/0.5 IM SUSY: 0.5000 mL | PREFILLED_SYRINGE | Freq: Once | INTRAMUSCULAR | Status: DC

## 2021-04-10 MED ORDER — PRENATAL MULTIVITAMIN CH
1.0000 | ORAL_TABLET | Freq: Every day | ORAL | Status: DC
Start: 2021-04-10 — End: 2021-04-12
  Administered 2021-04-10 – 2021-04-12 (×3): 1 via ORAL
  Filled 2021-04-10 (×3): qty 1

## 2021-04-10 MED ORDER — BENZOCAINE-MENTHOL 20-0.5 % EX AERO
1.0000 "application " | INHALATION_SPRAY | CUTANEOUS | Status: DC | PRN
  Administered 2021-04-10: 1 via TOPICAL
  Filled 2021-04-10: qty 56

## 2021-04-10 MED ORDER — SENNOSIDES-DOCUSATE SODIUM 8.6-50 MG PO TABS
2.0000 | ORAL_TABLET | Freq: Every day | ORAL | Status: DC
  Administered 2021-04-11 – 2021-04-12 (×2): 2 via ORAL
  Filled 2021-04-10 (×2): qty 2

## 2021-04-10 MED ORDER — COCONUT OIL OIL
1.0000 "application " | TOPICAL_OIL | Status: DC | PRN
  Administered 2021-04-11: 1 via TOPICAL

## 2021-04-10 MED ORDER — NALOXONE HCL 0.4 MG/ML IJ SOLN
INTRAMUSCULAR | Status: AC
  Filled 2021-04-10: qty 1

## 2021-04-10 MED ORDER — WITCH HAZEL-GLYCERIN EX PADS: 1.0000 "application " | MEDICATED_PAD | CUTANEOUS | Status: DC | PRN

## 2021-04-10 MED ORDER — SIMETHICONE 80 MG PO CHEW: 80.0000 mg | CHEWABLE_TABLET | ORAL | Status: DC | PRN

## 2021-04-10 MED ORDER — ONDANSETRON HCL 4 MG PO TABS: 4.0000 mg | ORAL_TABLET | ORAL | Status: DC | PRN

## 2021-04-10 MED ORDER — DIBUCAINE (PERIANAL) 1 % EX OINT: 1.0000 "application " | TOPICAL_OINTMENT | CUTANEOUS | Status: DC | PRN

## 2021-04-10 MED ORDER — IBUPROFEN 600 MG PO TABS
600.0000 mg | ORAL_TABLET | Freq: Four times a day (QID) | ORAL | Status: DC
  Administered 2021-04-10 – 2021-04-12 (×9): 600 mg via ORAL
  Filled 2021-04-10 (×9): qty 1

## 2021-04-10 MED ORDER — ZOLPIDEM TARTRATE 5 MG PO TABS: 5.0000 mg | ORAL_TABLET | Freq: Every evening | ORAL | Status: DC | PRN

## 2021-04-10 MED ORDER — DIPHENHYDRAMINE HCL 25 MG PO CAPS: 25.0000 mg | ORAL_CAPSULE | Freq: Four times a day (QID) | ORAL | Status: DC | PRN

## 2021-04-10 MED ORDER — SODIUM BICARBONATE 8.4 % IV SOLN
INTRAVENOUS | Status: DC | PRN
  Administered 2021-04-10: 5 mL via EPIDURAL

## 2021-04-10 NOTE — Progress Notes (Signed)
Claudia Davis is a 30 y.o. G1P0000 at [redacted]w[redacted]d admitted for rupture of membranes  Subjective: Resting in RT lateral position. Pain well managed with epidural.  Objective: BP 112/65   Pulse (!) 129   Temp 97.6 F (36.4 C) (Oral)   Resp 20   Ht 4\' 11"  (1.499 m)   Wt 69.4 kg   LMP 07/05/2020   SpO2 98%   BMI 30.90 kg/m  No intake/output data recorded. No intake/output data recorded.  FHT:  FHR: 145 bpm, variability: moderate,  accelerations:  Abscent,  decelerations:  Present variables UC:   regular, every 2-4 minutes SVE:   Dilation: 9 Effacement (%): 90 Station: -1 Exam by:: [email or web address], CNM AROM of forebag with moderate amount of clear fluid  Labs: Lab Results  Component Value Date   WBC 16.6 (H) 04/09/2021   HGB 13.4 04/09/2021   HCT 40.4 04/09/2021   MCV 94.0 04/09/2021   PLT 249 04/09/2021    Assessment / Plan: Spontaneous labor, progressing normally  Labor: Progressing normally Preeclampsia:   n/a Fetal Wellbeing:  Category II Pain Control:  Epidural I/D:  n/a Anticipated MOD:  NSVD  06/09/2021, CNM 04/10/2021, 2:44 AM

## 2021-04-10 NOTE — Discharge Summary (Signed)
Postpartum Discharge Summary  Date of Service updated     Patient Name: Claudia Davis DOB: 1990-10-30 MRN: [id number]  Date of admission: 04/09/2021 Delivery date:04/10/2021  Delivering provider: Laury Deep  Date of discharge: 04/12/2021  Admitting diagnosis: Supervision of other normal pregnancy, antepartum [Z34.80] Intrauterine pregnancy: [redacted]w[redacted]d     Secondary diagnosis:  Active Problems:   Depression with anxiety   Supervision of other normal pregnancy, antepartum  Additional problems: none    Discharge diagnosis: Term Pregnancy Delivered                                              Post partum procedures: None Augmentation: AROM Complications: Placental Abruption  Hospital course: Onset of Labor With Vaginal Delivery      30 y.o. yo G1P0000 at [redacted]w[redacted]d was admitted in Active Labor on 04/09/2021. Patient had an uncomplicated labor course as follows:  Membrane Rupture Time/Date: 2:35 AM ,04/10/2021   Delivery Method:Vaginal, Spontaneous  Episiotomy: None  Lacerations:  1st degree;Labial  Patient had an uncomplicated postpartum course.  She is ambulating, tolerating a regular diet, passing flatus, and urinating well. Patient is discharged home in stable condition on 04/12/21.  Newborn Data: Birth date:04/10/2021  Birth time:8:20 AM  Gender:Female  Living status:Living  Apgars:7 ,8  Weight:2744 g   Magnesium Sulfate received: No BMZ received: No Rhophylac:N/A MMR: N/A T-DaP:Given prenatally Flu: No Transfusion:No  Physical exam  Vitals:   04/11/21 0536 04/11/21 1500 04/11/21 2200 04/12/21 0500  BP: 122/82 120/80 108/84 124/89  Pulse: 89 94 98 90  Resp: $Remo'18 16 17   'idkyT$ Temp: 98.1 F (36.7 C) 98.3 F (36.8 C) 98 F (36.7 C) 97.8 F (36.6 C)  TempSrc: Oral Oral Oral Oral  SpO2: 99%  99%   Weight:      Height:       General: alert, cooperative, and no distress Lochia: appropriate Uterine Fundus: firm Incision: N/A DVT Evaluation: No evidence of DVT seen on  physical exam. No significant calf/ankle edema. Labs: Lab Results  Component Value Date   WBC 16.6 (H) 04/09/2021   HGB 13.4 04/09/2021   HCT 40.4 04/09/2021   MCV 94.0 04/09/2021   PLT 249 04/09/2021   CMP Latest Ref Rng & Units 11/17/2020  Glucose 65 - 99 mg/dL 84  BUN 6 - 20 mg/dL 5(L)  Creatinine 0.57 - 1.00 mg/dL 0.58  Sodium 134 - 144 mmol/L 138  Potassium 3.5 - 5.2 mmol/L 3.9  Chloride 96 - 106 mmol/L 103  CO2 20 - 29 mmol/L 21  Calcium 8.7 - 10.2 mg/dL 8.9  Total Protein 6.0 - 8.5 g/dL 6.4  Total Bilirubin 0.0 - 1.2 mg/dL <0.2  Alkaline Phos 44 - 121 IU/L 57  AST 0 - 40 IU/L 12  ALT 0 - 32 IU/L 13   Edinburgh Score: Edinburgh Postnatal Depression Scale Screening Tool 04/11/2021  I have been able to laugh and see the funny side of things. 0  I have looked forward with enjoyment to things. 0  I have blamed myself unnecessarily when things went wrong. 1  I have been anxious or worried for no good reason. 1  I have felt scared or panicky for no good reason. 0  Things have been getting on top of me. 0  I have been so unhappy that I have had difficulty sleeping. 0  I  have felt sad or miserable. 0  I have been so unhappy that I have been crying. 0  The thought of harming myself has occurred to me. 0  Edinburgh Postnatal Depression Scale Total 2     After visit meds:  Allergies as of 04/12/2021       Reactions   Amoxicillin Swelling   Throat swells        Medication List     STOP taking these medications    cyclobenzaprine 10 MG tablet Commonly known as: FLEXERIL   promethazine 25 MG tablet Commonly known as: PHENERGAN       TAKE these medications    acetaminophen 325 MG tablet Commonly known as: Tylenol Take 2 tablets (650 mg total) by mouth every 4 (four) hours as needed (for pain scale < 4).   ibuprofen 600 MG tablet Commonly known as: ADVIL Take 1 tablet (600 mg total) by mouth every 6 (six) hours as needed for moderate pain or cramping.    Pre-Natal Formula Tabs Take one daily.         Discharge home in stable condition Infant Feeding: Breast Infant Disposition:home with mother Discharge instruction: per After Visit Summary and Postpartum booklet. Activity: Advance as tolerated. Pelvic rest for 6 weeks.  Diet: routine diet Future Appointments: Future Appointments  Date Time Provider [place]  05/11/2021  9:15 AM Laury Deep, CNM [facility] None   Follow up Visit:  Message sent to [facility] by R. Renato Battles, CNM on 04/10/21 @ 0935: Please schedule this patient for a Virtual postpartum visit in 4 weeks with the following provider: Any provider. Additional Postpartum F/U:Postpartum Depression checkup - h/o anxiety Low risk pregnancy complicated by:  LT pelvic kidney Delivery mode:  Vaginal, Spontaneous  Anticipated Birth Control:  Depo    04/12/2021 Patriciaann Clan, DO

## 2021-04-10 NOTE — Progress Notes (Addendum)
Claudia Davis is a 30 y.o. G1P0000 at [redacted]w[redacted]d admitted for active labor, rupture of membranes  Subjective: In the room with RN, unable to trace FHR with EFM. Patient complaining of "feeling really weird, numbness in chest, and difficulty breathing. Epidural turned off by RN. Dr. Tacy Dura, anesthesiologist called to the room to assist with high epidural zone.  Objective: BP 99/78   Pulse (!) 114   Temp 97.6 F (36.4 C) (Oral)   Resp 20   Ht 4\' 11"  (1.499 m)   Wt 69.4 kg   LMP 07/05/2020   SpO2 91%   BMI 30.90 kg/m  I/O last 3 completed shifts: In: -  Out: 250 [Urine:250] No intake/output data recorded.  FHT:  FHR: 140 bpm, variability: moderate,  accelerations:  Present,  decelerations:  Present prolonged variable UC:   regular, every 1-5 minutes SVE:   Dilation: 10 Effacement (%): 100 Station: Plus 3 Exam by:: [email or web address], CNM FSE applied to scalp without difficulty  Labs: Lab Results  Component Value Date   WBC 16.6 (H) 04/09/2021   HGB 13.4 04/09/2021   HCT 40.4 04/09/2021   MCV 94.0 04/09/2021   PLT 249 04/09/2021    Assessment / Plan: Spontaneous labor, progressing normally  Labor: Progressing normally Preeclampsia:   n/a Fetal Wellbeing:  Category II Pain Control:  Epidural I/D:  n/a Anticipated MOD:  NSVD  06/09/2021, CNM 04/10/2021, 6:45 AM

## 2021-04-10 NOTE — Lactation Note (Signed)
This note was copied from a baby's chart. Lactation Consultation Note  Patient Name: Claudia Davis [id number] Date: 04/10/2021 Reason for consult: Initial assessment Age:29 hours P1, Mother of 39-[redacted] week GA infant , 6-0. Mother reports that infant has been STS but has not breastfed yet.  Infant STS with father at this time.  LC offered assistance with latch.  Infant placed in football hold. Assist with hand expression. Infant latched quickly with flanged lips. Infant sustained latch for 25 mins. Observed frequent suck swallow pattern. Father and MGM at the bedside.  Basic breastfeeding teaching done. [facility]  brochure given.  Advised in cue base feeding discussed cluster feeding the second night of life.  Encouraged to feed infant 8-12 or more times in 24 hours.  Maternal Data Has patient been taught Hand Expression?: Yes Does the patient have breastfeeding experience prior to this delivery?: No  Feeding Mother's Current Feeding Choice: Breast Milk  LATCH Score Latch: Grasps breast easily, tongue down, lips flanged, rhythmical sucking.  Audible Swallowing: Spontaneous and intermittent  Type of Nipple: Everted at rest and after stimulation  Comfort (Breast/Nipple): Soft / non-tender  Hold (Positioning): Assistance needed to correctly position infant at breast and maintain latch.  LATCH Score: 9   Lactation Tools Discussed/Used    Interventions Interventions: Breast feeding basics reviewed;Assisted with latch;Skin to skin;Hand express;Breast compression;Adjust position;Support pillows;Position options;Education  Discharge Pump: Personal WIC Program: No  Consult Status Consult Status: Follow-up Date: 04/10/21 Follow-up type: In-patient    Stevan Born [facility] 04/10/2021, 1:26 PM

## 2021-04-10 NOTE — Anesthesia Postprocedure Evaluation (Signed)
Anesthesia Post Note  Patient: Claudia Davis  Procedure(s) Performed: AN AD HOC LABOR EPIDURAL     Patient location during evaluation: Mother Baby Anesthesia Type: Epidural Level of consciousness: awake and alert Pain management: pain level controlled Vital Signs Assessment: post-procedure vital signs reviewed and stable Respiratory status: spontaneous breathing, nonlabored ventilation and respiratory function stable Cardiovascular status: stable Postop Assessment: no headache, no backache and epidural receding Anesthetic complications: no   No notable events documented.  Last Vitals:  Vitals:   04/10/21 1135 04/10/21 1624  BP: 118/74 110/69  Pulse: (!) 104 100  Resp: 18 18  Temp: 36.6 C 36.8 C  SpO2: 100% 99%    Last Pain:  Vitals:   04/10/21 1033  TempSrc: Oral  PainSc:                  [name]

## 2021-04-10 NOTE — Progress Notes (Signed)
Patient ID: Claudia Davis, female   DOB: Dec 05, 1990, 30 y.o.   MRN: [id number]  Patient tired requesting to rest. Will allow to labor down for 20-30 minutes.  Raelyn Mora, CNM  04/10/2021 7:45 AM

## 2021-04-11 NOTE — Social Work (Signed)
CSW received consult for hx of Anxiety and Depression.  CSW met with MOB to offer support and complete assessment.     CSW introduced self and role. CSW observed MGM Abigail Butts present and infant 'Jannifer Rodney' absent for a procedure. MOB declined to speak in private, stating MGM could remain in room. CSW informed MOB of reason for consult. MOB was understanding and shared she has a history of anxiety. MOB stated she was diagnosed in February of 2021 and shared she had no symptoms during pregnancy. MOB shared she was initially prescribed Prozac to treat the anxiety and that she stopped the medication prior to pregnancy. MOB reported she still has access to the prescription if needed postpartum. CSW assessed MOB current emotions. MOB stated she is currently feeling fine. MOB expressed mild anxiety, which she attributes to the birth of infant. MOB identified the symptoms as being manageable. MOB identified FOB and her parents as supports. MOB denies any current SI or HI.   CSW provided education regarding the baby blues period vs. perinatal mood disorders, discussed treatment and gave resources for mental health follow up if concerns arise.  CSW recommended self-evaluation during the postpartum time period using the New Mom Checklist.   CSW provided review of Sudden Infant Death Syndrome (SIDS) precautions. MOB stated she has a packn'play and car seat for infant. MOB identified [facility] for follow-up care and denies any barriers to care. MOB reported no additional needs at this time.    CSW identifies no further need for intervention and no barriers to discharge at this time.  Darra Lis, [place] [employer] [phone]

## 2021-04-11 NOTE — Lactation Note (Signed)
This note was copied from a baby's chart. Lactation Consultation Note  Patient Name: Claudia Davis [id number] Date: 04/11/2021   Age:29 hours LC went in to see how breastfeeding going. Mom stating feeding going well. She denies any pain with the latch. Mom asked to placed PRN on lactation service. She asked for assistance in placing an order for a Stork pump. LC completed order and called in to provide mothers name and DOB.   Mom has the number to follow up with insurance company if she does not receive her pump by tomorrow.  Mom states she has BCBS and gets full coverage for breast pump through her insurance.     Maternal Data    Feeding    LATCH Score                    Lactation Tools Discussed/Used    Interventions    Discharge    Consult Status      Xuan Mateus  Nicholson-Springer 04/11/2021, 4:46 PM

## 2021-04-11 NOTE — Progress Notes (Signed)
POSTPARTUM PROGRESS NOTE  Subjective: Claudia Davis is a 30 y.o. G1P1001 PPD#1 s/p NSVD at [redacted]w[redacted]d.  She reports she doing well. No acute events overnight. She denies any problems with ambulating, voiding or po intake. Denies nausea or vomiting. She has  passed flatus. Pain is well controlled.  Lochia is scant.  Would like to stay today and go home tomorrow. Considering depo or pills for birth control. Will decide before discharge tomorrow  Objective: Blood pressure 122/82, pulse 89, temperature 98.1 F (36.7 C), temperature source Oral, resp. rate 18, height 4\' 11"  (1.499 m), weight 69.4 kg, last menstrual period 07/05/2020, SpO2 99 %.  Physical Exam:  General: alert, cooperative and no distress Chest: no respiratory distress Abdomen: Fundus firm at umbilicus Uterine Fundus: firm, appropriately tender Extremities: No calf swelling or tenderness  No edema  Recent Labs    04/09/21 1937  HGB 13.4  HCT 40.4    Assessment/Plan: Claudia Davis is a 30 y.o. G1P1001 PPD#1 s/p NSVD at [redacted]w[redacted]d   Routine Postpartum Care: Doing well, pain well-controlled. Meeting post partum milestones -- Continue routine care, lactation support  -- Contraception: still deciding- considering depo -- Feeding: Breast  Dispo: Plan for discharge on PPD#2.  [redacted]w[redacted]d, MD, MPH OB Fellow, [facility] for [facility]

## 2021-04-12 ENCOUNTER — Encounter (HOSPITAL_COMMUNITY): Payer: Self-pay

## 2021-04-12 DIAGNOSIS — O326XX0 Maternal care for compound presentation, not applicable or unspecified: Secondary | ICD-10-CM | POA: Diagnosis not present

## 2021-04-12 MED ORDER — IBUPROFEN 600 MG PO TABS: 600.0000 mg | ORAL_TABLET | Freq: Four times a day (QID) | ORAL | 0 refills | Status: DC | PRN

## 2021-04-12 MED ORDER — MEDROXYPROGESTERONE ACETATE 150 MG/ML IM SUSP
150.0000 mg | Freq: Once | INTRAMUSCULAR | Status: AC
  Administered 2021-04-12: 150 mg via INTRAMUSCULAR
  Filled 2021-04-12: qty 1

## 2021-04-12 MED ORDER — ACETAMINOPHEN 325 MG PO TABS: 650.0000 mg | ORAL_TABLET | ORAL | Status: AC | PRN

## 2021-04-12 NOTE — Lactation Note (Signed)
This note was copied from a baby's chart. Lactation Consultation Note  Patient Name: Claudia Davis UTMLY'Y Date: 04/12/2021 Reason for consult: Follow-up assessment;Mother's request;Term Age:29 hours  P1 mother whose infant is now 67 hours old.  This is a term baby at 39+1 weeks.  Mother was a prn status but requested lactation prior to being discharged.  Visited with family as mother was finishing breast feeding.  Mother requested nursing pads and a manual pump.  Reviewed feeding plan for after discharge.  Provided a manual pump with instructions for use.  #24 flange size is appropriate at this time.  Mother has a DEBP for home use.  Father and grandmother present and supportive.  Mother has our OP phone number for any questions/concerns after discharge.  She will be seeing a Advertising copywriter in the [facility] after discharge.   Maternal Data    Feeding Mother's Current Feeding Choice: Breast Milk  LATCH Score                    Lactation Tools Discussed/Used    Interventions Interventions: Breast feeding basics reviewed;Education  Discharge Discharge Education: Engorgement and breast care Pump: Personal  Consult Status Consult Status: Complete Date: 04/12/21 Follow-up type: Call as needed    Yousef Huge R Shery [place] 04/12/2021, 11:44 AM

## 2021-04-13 ENCOUNTER — Encounter: Payer: Self-pay | Admitting: Obstetrics and Gynecology

## 2021-04-16 ENCOUNTER — Inpatient Hospital Stay (HOSPITAL_COMMUNITY): Admit: 2021-04-16 | Payer: Self-pay

## 2021-04-25 ENCOUNTER — Telehealth (HOSPITAL_COMMUNITY): Payer: Self-pay | Admitting: *Deleted

## 2021-04-25 NOTE — Telephone Encounter (Signed)
Patient voiced no questions or concerns regarding her own health. EPDS = 3. Patient voiced no questions or concerns regarding baby at this time. Patient reported infant sleeps in a bassinet on his back. RN reviewed ABCs of safe sleep - patient verbalized understanding. Patient requested RN email information on hospital's virtual postpartum classes and support groups. Email sent. Deforest Hoyles, RN, 04/25/21, [phone].

## 2021-05-11 ENCOUNTER — Ambulatory Visit: Payer: Self-pay | Admitting: Obstetrics and Gynecology

## 2021-05-18 ENCOUNTER — Ambulatory Visit: Payer: Federal, State, Local not specified - PPO | Admitting: Family Medicine

## 2021-05-24 ENCOUNTER — Ambulatory Visit: Payer: Self-pay | Admitting: Advanced Practice Midwife

## 2021-05-26 ENCOUNTER — Encounter: Payer: Self-pay | Admitting: Obstetrics

## 2021-05-26 ENCOUNTER — Telehealth (INDEPENDENT_AMBULATORY_CARE_PROVIDER_SITE_OTHER): Payer: Federal, State, Local not specified - PPO | Admitting: Obstetrics

## 2021-05-26 DIAGNOSIS — F53 Postpartum depression: Secondary | ICD-10-CM | POA: Diagnosis not present

## 2021-05-26 DIAGNOSIS — Z3042 Encounter for surveillance of injectable contraceptive: Secondary | ICD-10-CM

## 2021-05-26 MED ORDER — SERTRALINE HCL 50 MG PO TABS: 50.0000 mg | ORAL_TABLET | Freq: Every day | ORAL | 11 refills | Status: AC

## 2021-05-26 NOTE — Progress Notes (Signed)
Provider location: Center for [facility]   Patient location: Home  I connected withNAME@ on 05/26/21 at  4:10 PM EDT by Mychart Video Encounter and verified that I am speaking with the correct person using two identifiers.       I discussed the limitations, risks, security and privacy concerns of performing an evaluation and management service virtually and the availability of in person appointments. I also discussed with the patient that there may be a patient responsible charge related to this service. The patient expressed understanding and agreed to proceed.  Post Partum Visit Note Subjective:   Claudia Davis is a 30 y.o. G59P0000 female who presents for a postpartum visit. She is 6 weeks postpartum following a normal spontaneous vaginal delivery.  I have fully reviewed the prenatal and intrapartum course. The delivery was at 39.1 gestational weeks.  Anesthesia: epidural. Postpartum course has been UNREMARKABLE. Baby is doing well. Baby is feeding by breast. Bleeding no bleeding. Bowel function is normal. Bladder function is normal. Patient is sexually active. Contraception method is Depo-Provera injections. Postpartum depression screening: negative.   The pregnancy intention screening data noted above was reviewed. Potential methods of contraception were discussed. The patient elected to proceed with No data recorded.   Edinburgh Postnatal Depression Scale - 05/26/21 1634       Edinburgh Postnatal Depression Scale:  In the Past 7 Days   I have been able to laugh and see the funny side of things. 0    I have looked forward with enjoyment to things. 0    I have blamed myself unnecessarily when things went wrong. 2    I have been anxious or worried for no good reason. 0    I have felt scared or panicky for no good reason. 0    Things have been getting on top of me. 2    I have been so unhappy that I have had difficulty sleeping. 0    I have felt sad or miserable.  1    I have been so unhappy that I have been crying. 0    The thought of harming myself has occurred to me. 0    Edinburgh Postnatal Depression Scale Total 5             The following portions of the patient's history were reviewed and updated as appropriate: allergies, current medications, past family history, past medical history, past social history, past surgical history, and problem list.  Review of Systems A comprehensive review of systems was negative except for: Behavioral/Psych: positive for depression  Objective:  Breastfeeding Yes     General:  Alert, oriented and cooperative. Patient is in no acute distress.  Respiratory: Normal respiratory effort, no problems with respiration noted  Mental Status: Normal mood and affect. Normal behavior. Normal judgment and thought content.  Rest of physical exam deferred due to type of encounter   Assessment:   1. Postpartum care following vaginal delivery - doing well except for postpartum depression  2. Postpartum depression Rx: - Ambulatory referral to [facility] - sertraline (ZOLOFT) 50 MG tablet; Take 1 tablet (50 mg total) by mouth daily.  Dispense: 30 tablet; Refill: 11  3. Encounter for surveillance of injectable contraceptive - pl;eased with Depo    Plan:  Essential components of care per ACOG recommendations:  1.  Mood and well being: Patient with positive depression screening today. Reviewed local resources for support.  - Patient does not use tobacco.  -  hx of drug use? No    2. Infant care and feeding:  -Patient currently breastmilk feeding? Yes If breastmilk feeding discussed return to work and pumping. If needed, patient was provided letter for work to allow for every 2-3 hr pumping breaks, and to be granted a private location to express breastmilk and refrigerated area to store breastmilk. Reviewed importance of draining breast regularly to support lactation. -Social determinants of health  (SDOH) reviewed in EPIC. No concerns/  3. Sexuality, contraception and birth spacing - Patient does not want a pregnancy in the next year.  Desired family size is 2 children.  - Reviewed forms of contraception in tiered fashion. Patient desired Depo-Provera today.   - Discussed birth spacing of 18 months  4. Sleep and fatigue -Encouraged family/partner/community support of 4 hrs of uninterrupted sleep to help with mood and fatigue  5. Physical Recovery  - Discussed patients delivery and complications - Patient had a 1st degree laceration, perineal healing reviewed. Patient expressed understanding - Patient has urinary incontinence? No - Patient is safe to resume physic2al and sexual activity  6.  Health Maintenance - Last pap smear done 09-23-2020 and was normal with negative HPV.  7. Chronic Disease:  H/O Depression - PCP follow up  I have spent a total of 20 minutes of face-to-face time, excluding clinical staff time, reviewing notes and preparing to see patient, ordering tests and/or medications, and counseling the patient.     No follow-ups on file.  Future Appointments  Date Time Provider Department Center  07/04/2021 10:40 AM [facility] NURSE [facility] None     Center for [employer], [facility]

## 2021-06-02 ENCOUNTER — Other Ambulatory Visit: Payer: Self-pay | Admitting: Obstetrics and Gynecology

## 2021-06-02 DIAGNOSIS — F418 Other specified anxiety disorders: Secondary | ICD-10-CM

## 2021-06-02 MED ORDER — FLUOXETINE HCL 20 MG PO CAPS: ORAL_CAPSULE | ORAL | 3 refills | Status: AC

## 2021-07-04 ENCOUNTER — Other Ambulatory Visit: Payer: Self-pay

## 2021-07-04 ENCOUNTER — Ambulatory Visit (INDEPENDENT_AMBULATORY_CARE_PROVIDER_SITE_OTHER): Payer: Federal, State, Local not specified - PPO | Admitting: *Deleted

## 2021-07-04 VITALS — BP 117/71 | HR 85

## 2021-07-04 DIAGNOSIS — Z3042 Encounter for surveillance of injectable contraceptive: Secondary | ICD-10-CM | POA: Diagnosis not present

## 2021-07-04 MED ORDER — MEDROXYPROGESTERONE ACETATE 150 MG/ML IM SUSY: 150.0000 mg | PREFILLED_SYRINGE | INTRAMUSCULAR | 1 refills | Status: AC

## 2021-07-04 MED ORDER — MEDROXYPROGESTERONE ACETATE 150 MG/ML IM SUSP
150.0000 mg | Freq: Once | INTRAMUSCULAR | Status: AC
  Administered 2021-07-04: 150 mg via INTRAMUSCULAR

## 2021-07-04 NOTE — Progress Notes (Signed)
Date last pap: 09/23/20. Advised to schedule annual with next depo. Last Depo-Provera: Postpartum 04/12/21. Side Effects if any: NA. Serum HCG indicated? NA. Depo-Provera 150 mg IM given by: Selena Batten. Claudia Davis, RNC in Right Deltoid. From office supply. (RX not sent in by in hospital provider after delivery) Next appointment due 09/19/21-10/03/21.   RX depo sent to get through to 09/2021

## 2021-09-02 ENCOUNTER — Emergency Department (HOSPITAL_BASED_OUTPATIENT_CLINIC_OR_DEPARTMENT_OTHER)
Admission: EM | Admit: 2021-09-02 | Discharge: 2021-09-02 | Disposition: A | Discharge status: Home / Self Care | Payer: Federal, State, Local not specified - PPO | Attending: Emergency Medicine | Admitting: Emergency Medicine

## 2021-09-02 ENCOUNTER — Emergency Department (HOSPITAL_BASED_OUTPATIENT_CLINIC_OR_DEPARTMENT_OTHER): Payer: Federal, State, Local not specified - PPO

## 2021-09-02 ENCOUNTER — Encounter (HOSPITAL_BASED_OUTPATIENT_CLINIC_OR_DEPARTMENT_OTHER): Payer: Self-pay | Admitting: *Deleted

## 2021-09-02 ENCOUNTER — Other Ambulatory Visit: Payer: Self-pay

## 2021-09-02 DIAGNOSIS — M25531 Pain in right wrist: Secondary | ICD-10-CM | POA: Diagnosis not present

## 2021-09-02 DIAGNOSIS — Y92410 Unspecified street and highway as the place of occurrence of the external cause: Secondary | ICD-10-CM | POA: Insufficient documentation

## 2021-09-02 DIAGNOSIS — S66911A Strain of unspecified muscle, fascia and tendon at wrist and hand level, right hand, initial encounter: Secondary | ICD-10-CM | POA: Insufficient documentation

## 2021-09-02 DIAGNOSIS — Z041 Encounter for examination and observation following transport accident: Secondary | ICD-10-CM | POA: Diagnosis not present

## 2021-09-02 DIAGNOSIS — S6991XA Unspecified injury of right wrist, hand and finger(s), initial encounter: Secondary | ICD-10-CM | POA: Diagnosis not present

## 2021-09-02 DIAGNOSIS — Z79899 Other long term (current) drug therapy: Secondary | ICD-10-CM | POA: Insufficient documentation
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
## 2021-09-02 NOTE — ED Notes (Signed)
Pt ambulatory to restroom with independent steady gait °

## 2021-09-22 ENCOUNTER — Ambulatory Visit: Payer: Federal, State, Local not specified - PPO

## 2021-09-22 DIAGNOSIS — Z3042 Encounter for surveillance of injectable contraceptive: Secondary | ICD-10-CM

## 2021-09-30 ENCOUNTER — Other Ambulatory Visit: Payer: Self-pay | Admitting: Obstetrics

## 2021-09-30 DIAGNOSIS — F53 Postpartum depression: Secondary | ICD-10-CM

## 2021-10-19 ENCOUNTER — Encounter: Payer: Self-pay | Admitting: Family Medicine

## 2021-10-30 DIAGNOSIS — J019 Acute sinusitis, unspecified: Secondary | ICD-10-CM | POA: Diagnosis not present

## 2021-10-30 DIAGNOSIS — R1110 Vomiting, unspecified: Secondary | ICD-10-CM | POA: Diagnosis not present

## 2021-10-30 DIAGNOSIS — E663 Overweight: Secondary | ICD-10-CM | POA: Diagnosis not present

## 2021-10-30 DIAGNOSIS — Z6827 Body mass index (BMI) 27.0-27.9, adult: Secondary | ICD-10-CM | POA: Diagnosis not present

## 2022-02-14 IMAGING — DX DG CHEST 1V PORT
1 series · 1 of 1 positions shown · non-contrast
Comparison: None.

CLINICAL DATA: COVID short of breath

EXAM:
PORTABLE CHEST 1 VIEW

[chest ap]
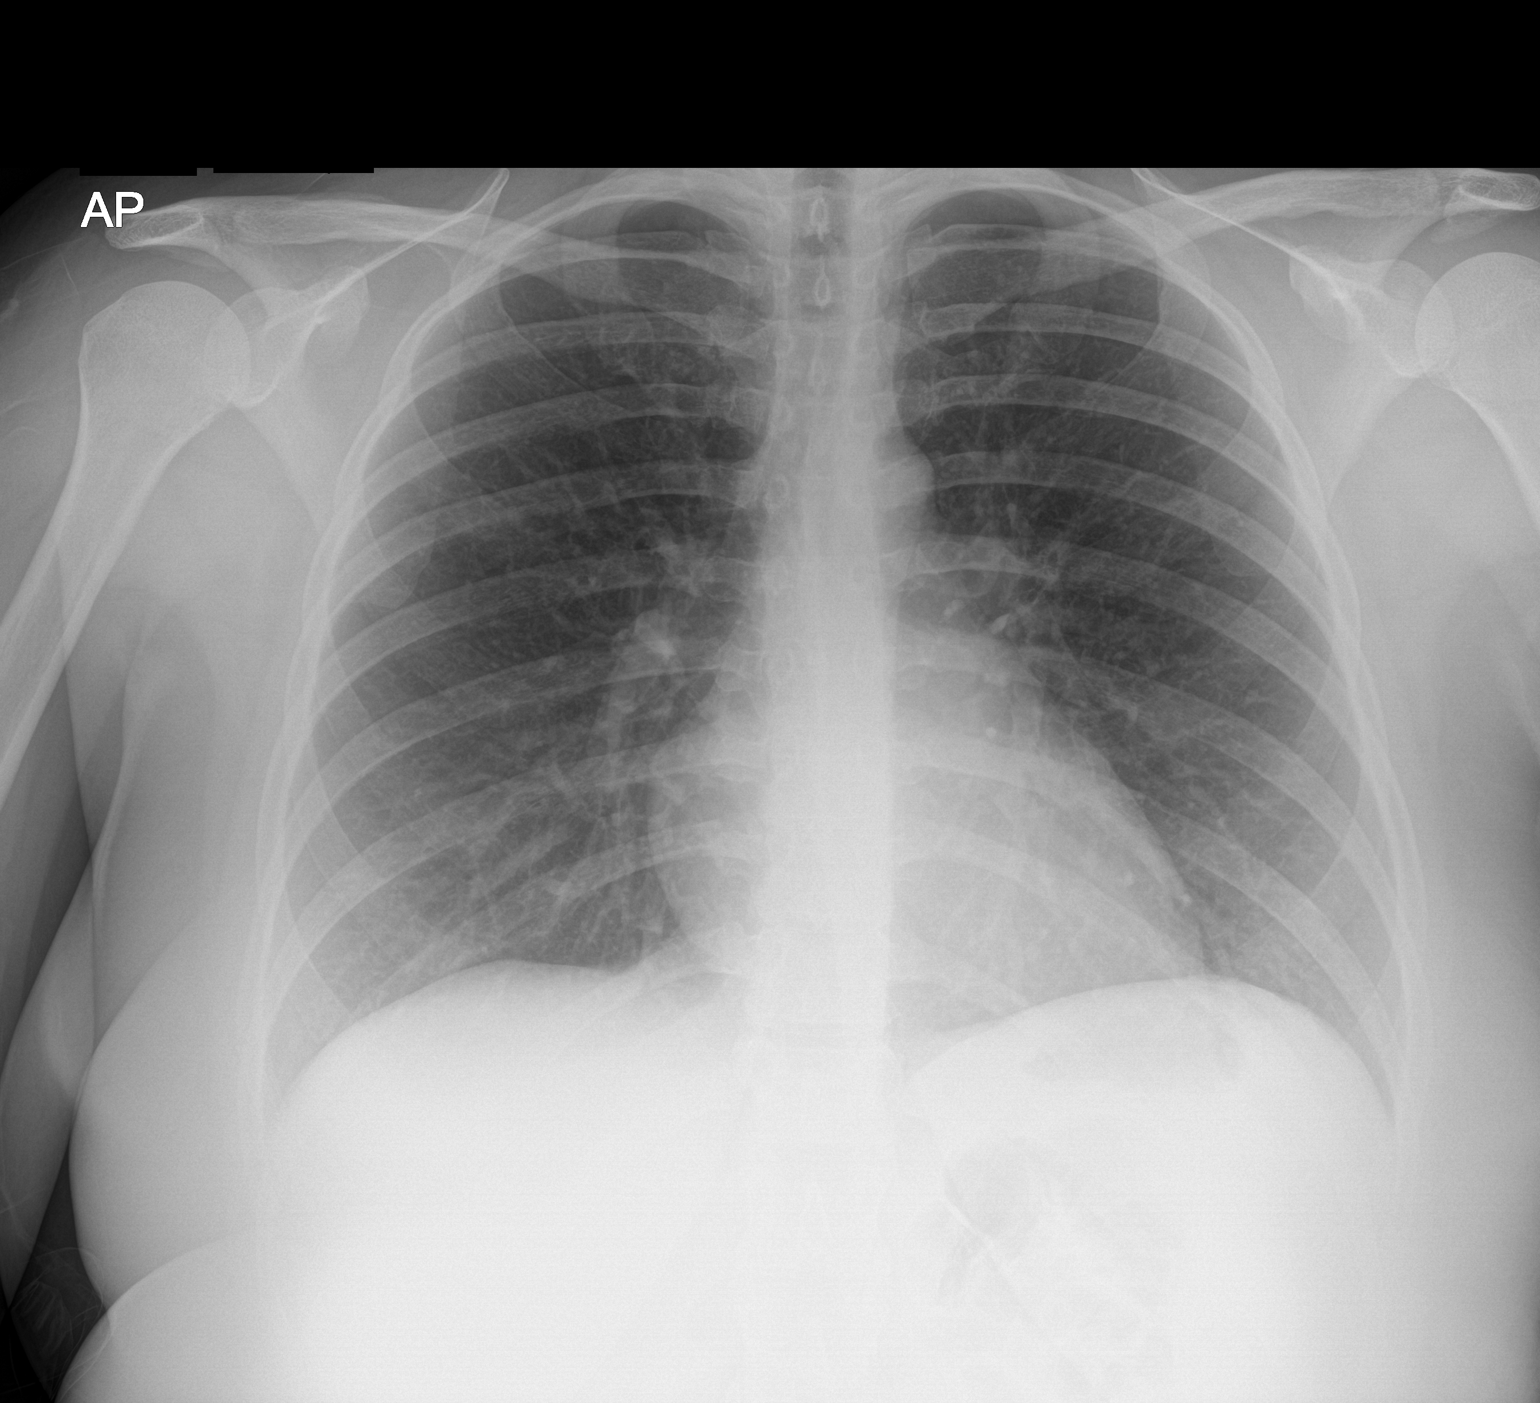

[1 of 1 positions shown; findings below may reference images not displayed]

FINDINGS: The heart size and mediastinal contours are within normal limits.
Both lungs are clear. The visualized skeletal structures are
unremarkable.
IMPRESSION: No active disease.

## 2022-03-10 DIAGNOSIS — R102 Pelvic and perineal pain: Secondary | ICD-10-CM | POA: Diagnosis not present

## 2022-03-10 DIAGNOSIS — N939 Abnormal uterine and vaginal bleeding, unspecified: Secondary | ICD-10-CM | POA: Diagnosis not present

## 2022-03-21 ENCOUNTER — Encounter: Payer: Self-pay | Admitting: Family Medicine

## 2022-03-21 ENCOUNTER — Ambulatory Visit: Payer: Federal, State, Local not specified - PPO | Admitting: Family Medicine

## 2022-03-21 ENCOUNTER — Other Ambulatory Visit (HOSPITAL_COMMUNITY)
Admission: RE | Admit: 2022-03-21 | Discharge: 2022-03-21 | Disposition: A | Discharge status: Home / Self Care | Payer: Federal, State, Local not specified - PPO | Source: Ambulatory Visit | Attending: Family Medicine | Admitting: Family Medicine

## 2022-03-21 VITALS — BP 116/80 | HR 92 | Temp 97.8°F | Wt 142.8 lb

## 2022-03-21 DIAGNOSIS — N939 Abnormal uterine and vaginal bleeding, unspecified: Secondary | ICD-10-CM

## 2022-03-21 DIAGNOSIS — M545 Low back pain, unspecified: Secondary | ICD-10-CM | POA: Insufficient documentation

## 2022-03-21 LAB — POCT URINALYSIS DIPSTICK
Bilirubin, UA: NEGATIVE
Blood, UA: NEGATIVE
Glucose, UA: NEGATIVE
Ketones, UA: NEGATIVE
Leukocytes, UA: NEGATIVE
Nitrite, UA: NEGATIVE
Protein, UA: NEGATIVE
Spec Grav, UA: 1.01 (ref 1.010–1.025)
Urobilinogen, UA: NEGATIVE E.U./dL — AB
pH, UA: 6 (ref 5.0–8.0)

## 2022-03-21 LAB — POCT URINE PREGNANCY: Preg Test, Ur: NEGATIVE

## 2022-03-21 MED ORDER — IBUPROFEN 600 MG PO TABS: 600.0000 mg | ORAL_TABLET | Freq: Four times a day (QID) | ORAL | 0 refills | Status: AC | PRN

## 2022-03-21 MED ORDER — ONDANSETRON HCL 4 MG PO TABS: 4.0000 mg | ORAL_TABLET | Freq: Three times a day (TID) | ORAL | 0 refills | Status: AC | PRN

## 2022-03-21 NOTE — Assessment & Plan Note (Signed)
AUB associated with flank pain Ddx:  structural uterine abnormalities, such as, endometrial polyps, uterine fibroids, adenomyosis, or malignancy early pregnancy-related complications, including spontaneous abortion, septic abortion, or ectopic pregnancy, although less likely given neg Upreg coagulopathies or bleeding disorders, less likely given no history of irregularity prior ovulatory dysfunction, including polycystic ovarian syndrome or other causes of oligoovulation, such as eating disorders or physical or psychological stress smoking iatrogenic sources, such as, medications or breakthrough bleeding with use of intrauterine device or noncompliance with hormonal contraceptives sexually transmitted infection sexual trauma  All questions answered. Agricultural engineer distributed. Abdominal ultrasound. Blood tests: CBC with diff, Comprehensive metabolic panel, and TSH. Follow-up as needed. Pelvic ultrasound. Pregnancy test, result: negative

## 2022-03-21 NOTE — Progress Notes (Signed)
Subjective:     Claudia Davis is a 31 y.o. woman who presents for irregular menses. Patient's last menstrual period was 02/14/2022. Menarche age: 86. Periods are regular every 28-30 days, lasting 7 days. Dysmenorrhea:moderate, occurring throughout menses. Cyclic symptoms include:  cramping, fatigue . Current contraception: Depo-Provera injections.Last Depo 09/2021. History of infertility: no. History of abnormal Pap smear: no. Worse 5 weeks ago, regular bleeding for 2 weeks, then returned, intermittent spotting.  Endorses some nausea. Endorses lower back pain, 7/10. No fevers, urinary problems, vomiting, diarrhea, vaginal discharge. Patient alternating ibuprofen and tylenol for pain. It does help some.   The following portions of the patient's history were reviewed and updated as appropriate: allergies, current medications, past family history, past medical history, past social history, past surgical history, and problem list.  Review of Systems Pertinent items noted in HPI and remainder of comprehensive ROS otherwise negative.     Objective:   Vitals:   03/21/22 1536  BP: 116/80  Pulse: 92  Temp: 97.8 F (36.6 C)  SpO2: 99%   General: No acute distress. Awake and conversant.  Eyes: Normal conjunctiva, anicteric. Round symmetric pupils.  ENT: Hearing grossly intact. No nasal discharge.  Neck: Neck is supple. No masses or thyromegaly.  Respiratory: Respirations are non-labored. No auditory wheezing.  ABD: nontender, nondistended GU: no flank pain Skin: Warm. No rashes or ulcers.  Psych: Alert and oriented. Cooperative, Appropriate mood and affect, Normal judgment.  CV: No cyanosis or JVD MSK: Normal ambulation. No clubbing  Neuro: CN II-XII grossly normal, no tremor     Assessment and Plan:   Problem List Items Addressed This Visit       Genitourinary   Abnormal uterine bleeding - Primary    AUB associated with flank pain Ddx:  structural uterine abnormalities, such as,  endometrial polyps, uterine fibroids, adenomyosis, or malignancy early pregnancy-related complications, including spontaneous abortion, septic abortion, or ectopic pregnancy, although less likely given neg Upreg coagulopathies or bleeding disorders, less likely given no history of irregularity prior ovulatory dysfunction, including polycystic ovarian syndrome or other causes of oligoovulation, such as eating disorders or physical or psychological stress smoking iatrogenic sources, such as, medications or breakthrough bleeding with use of intrauterine device or noncompliance with hormonal contraceptives sexually transmitted infection sexual trauma  All questions answered. Neurosurgeon distributed. Abdominal ultrasound. Blood tests: CBC with diff, Comprehensive metabolic panel, and TSH. Follow-up as needed. Pelvic ultrasound. Pregnancy test, result: negative      Relevant Medications   ondansetron (ZOFRAN) 4 MG tablet   ibuprofen (ADVIL) 600 MG tablet   Other Relevant Orders   POCT urine pregnancy (Completed)   Urine cytology ancillary only([place])   POCT Urinalysis Dipstick (Completed)   Ultrasound non-ob transvaginal   CBC w/Diff   TSH   Comp Met ([facility])     Other   Acute left-sided low back pain    Associated w/ AUB, flank pain, N/V Possible renal stone vs. UTI vs. GYN issue as above U/A neg UCx Ibuprofen Zofran       Relevant Medications   ondansetron (ZOFRAN) 4 MG tablet   ibuprofen (ADVIL) 600 MG tablet   Other Relevant Orders   POCT urine pregnancy (Completed)   Urine cytology ancillary only([place])   POCT Urinalysis Dipstick (Completed)   Ultrasound non-ob transvaginal   Urine Culture   Comp Met ([facility]ET)      Diagnosis explained in detail. All questions answered. Neurosurgeon distributed. Abdominal ultrasound. Blood tests: CBC with diff,  Comprehensive metabolic panel, and TSH. Follow-up as needed. Pelvic ultrasound. Pregnancy  test, result: negative

## 2022-03-21 NOTE — Assessment & Plan Note (Signed)
Associated w/ AUB, flank pain, N/V Possible renal stone vs. UTI vs. GYN issue as above U/A neg UCx Ibuprofen Zofran

## 2022-03-21 NOTE — Patient Instructions (Addendum)
Your pregnancy test was negative. We are checking urine for infection, but office test was negative. The culture is pending. We are testing for gonorrhea, chlamydia, thyroid disease, anemia. We are ordering a pelvic ultrasound.  Take ondansetron for nasuea, ibuprofen for pain.

## 2022-03-22 ENCOUNTER — Other Ambulatory Visit: Payer: Self-pay | Admitting: Family Medicine

## 2022-03-22 DIAGNOSIS — E039 Hypothyroidism, unspecified: Secondary | ICD-10-CM

## 2022-03-22 LAB — COMPREHENSIVE METABOLIC PANEL
ALT: 7 U/L (ref 0–35)
AST: 12 U/L (ref 0–37)
Albumin: 4.3 g/dL (ref 3.5–5.2)
Alkaline Phosphatase: 54 U/L (ref 39–117)
BUN: 12 mg/dL (ref 6–23)
CO2: 24 mEq/L (ref 19–32)
Calcium: 9.4 mg/dL (ref 8.4–10.5)
Chloride: 101 mEq/L (ref 96–112)
Creatinine, Ser: 0.87 mg/dL (ref 0.40–1.20)
GFR: 89.33 mL/min (ref >60.00)
Glucose, Bld: 87 mg/dL (ref 70–99)
Potassium: 4.6 mEq/L (ref 3.5–5.1)
Sodium: 136 mEq/L (ref 135–145)
Total Bilirubin: 0.4 mg/dL (ref 0.2–1.2)
Total Protein: 7.3 g/dL (ref 6.0–8.3)

## 2022-03-22 LAB — CBC WITH DIFFERENTIAL/PLATELET
Basophils Absolute: 0.1 10*3/uL (ref 0.0–0.1)
Basophils Relative: 0.9 % (ref 0.0–3.0)
Eosinophils Absolute: 0.1 10*3/uL (ref 0.0–0.7)
Eosinophils Relative: 1.3 % (ref 0.0–5.0)
HCT: 38.3 % (ref 36.0–46.0)
Hemoglobin: 13.2 g/dL (ref 12.0–15.0)
Lymphocytes Relative: 21.1 % (ref 12.0–46.0)
Lymphs Abs: 1.7 10*3/uL (ref 0.7–4.0)
MCHC: 34.4 g/dL (ref 30.0–36.0)
MCV: 92 fl (ref 78.0–100.0)
Monocytes Absolute: 0.8 10*3/uL (ref 0.1–1.0)
Monocytes Relative: 9.6 % (ref 3.0–12.0)
Neutro Abs: 5.4 10*3/uL (ref 1.4–7.7)
Neutrophils Relative %: 67.1 % (ref 43.0–77.0)
Platelets: 265 10*3/uL (ref 150.0–400.0)
RBC: 4.17 Mil/uL (ref 3.87–5.11)
RDW: 13.4 % (ref 11.5–15.5)
WBC: 8 10*3/uL (ref 4.0–10.5)

## 2022-03-22 LAB — URINE CULTURE
MICRO NUMBER:: 13814237
Result:: NO GROWTH
SPECIMEN QUALITY:: ADEQUATE

## 2022-03-22 LAB — TSH: TSH: 23.36 u[IU]/mL — ABNORMAL HIGH (ref 0.35–5.50)

## 2022-03-22 MED ORDER — LEVOTHYROXINE SODIUM 100 MCG PO TABS: 100.0000 ug | ORAL_TABLET | Freq: Every day | ORAL | 0 refills | Status: DC

## 2022-03-23 LAB — URINE CYTOLOGY ANCILLARY ONLY
Chlamydia: NEGATIVE
Comment: NEGATIVE
Comment: NORMAL
Neisseria Gonorrhea: NEGATIVE

## 2022-03-24 ENCOUNTER — Telehealth: Payer: Self-pay | Admitting: Family Medicine

## 2022-03-24 NOTE — Telephone Encounter (Signed)
Pt is wanting a refill on her FLUoxetine (PROZAC) 20 MG capsule [[id number]]. I let her know she would need an app, she hasn't seen Dr Doreene Burke since 11/2019. She just saw Dr Janee Morn recently, she thought that would work. She wanted me to put a message in first.  [id number] - [place], [place] - [address], [address] AT [facility] [address]  [address], [address], [place] [address]  Phone:  [phone]  Fax:  [phone]  DEA #:  [id number]

## 2022-03-24 NOTE — Telephone Encounter (Signed)
Spoke with patient who verbally understood she would need to come in for OV to see Dr. Doreene Burke for a follow up on medications. Call was lost after trying to schedule with patient tried calling back no answer LMTCB also sent message via Mychart asked patient to call to schedule appointment.

## 2022-03-29 ENCOUNTER — Ambulatory Visit (HOSPITAL_BASED_OUTPATIENT_CLINIC_OR_DEPARTMENT_OTHER): Payer: Federal, State, Local not specified - PPO

## 2022-04-18 ENCOUNTER — Other Ambulatory Visit: Payer: Self-pay | Admitting: Family Medicine

## 2022-04-18 DIAGNOSIS — E039 Hypothyroidism, unspecified: Secondary | ICD-10-CM

## 2022-04-18 NOTE — Telephone Encounter (Signed)
Left patient a detailed voice message to return call to office to schedule f/u appointment regarding refill on levothyroxine.

## 2022-04-25 ENCOUNTER — Ambulatory Visit: Payer: Federal, State, Local not specified - PPO | Admitting: Family Medicine

## 2022-04-26 DIAGNOSIS — E039 Hypothyroidism, unspecified: Secondary | ICD-10-CM | POA: Diagnosis not present

## 2022-04-26 DIAGNOSIS — N939 Abnormal uterine and vaginal bleeding, unspecified: Secondary | ICD-10-CM | POA: Diagnosis not present

## 2022-04-26 DIAGNOSIS — R102 Pelvic and perineal pain: Secondary | ICD-10-CM | POA: Diagnosis not present

## 2022-04-26 DIAGNOSIS — Z01419 Encounter for gynecological examination (general) (routine) without abnormal findings: Secondary | ICD-10-CM | POA: Diagnosis not present

## 2022-04-26 DIAGNOSIS — F418 Other specified anxiety disorders: Secondary | ICD-10-CM | POA: Diagnosis not present

## 2022-04-26 DIAGNOSIS — Z3202 Encounter for pregnancy test, result negative: Secondary | ICD-10-CM | POA: Diagnosis not present

## 2022-05-10 DIAGNOSIS — Z3202 Encounter for pregnancy test, result negative: Secondary | ICD-10-CM | POA: Diagnosis not present

## 2022-05-10 DIAGNOSIS — Z3042 Encounter for surveillance of injectable contraceptive: Secondary | ICD-10-CM | POA: Diagnosis not present

## 2022-05-22 ENCOUNTER — Other Ambulatory Visit: Payer: Self-pay | Admitting: Family Medicine

## 2022-05-22 DIAGNOSIS — E039 Hypothyroidism, unspecified: Secondary | ICD-10-CM

## 2022-06-29 DIAGNOSIS — E039 Hypothyroidism, unspecified: Secondary | ICD-10-CM | POA: Diagnosis not present

## 2022-06-29 DIAGNOSIS — F418 Other specified anxiety disorders: Secondary | ICD-10-CM | POA: Diagnosis not present

## 2022-08-02 DIAGNOSIS — Z3042 Encounter for surveillance of injectable contraceptive: Secondary | ICD-10-CM | POA: Diagnosis not present

## 2022-08-24 ENCOUNTER — Other Ambulatory Visit: Payer: Self-pay | Admitting: Obstetrics and Gynecology

## 2022-08-24 DIAGNOSIS — Z3042 Encounter for surveillance of injectable contraceptive: Secondary | ICD-10-CM

## 2022-09-11 DIAGNOSIS — R946 Abnormal results of thyroid function studies: Secondary | ICD-10-CM | POA: Diagnosis not present

## 2022-10-23 DIAGNOSIS — Z30017 Encounter for initial prescription of implantable subdermal contraceptive: Secondary | ICD-10-CM | POA: Diagnosis not present

## 2022-10-24 DIAGNOSIS — Z30017 Encounter for initial prescription of implantable subdermal contraceptive: Secondary | ICD-10-CM | POA: Diagnosis not present

## 2022-12-19 ENCOUNTER — Other Ambulatory Visit: Payer: Self-pay | Admitting: Family Medicine

## 2022-12-19 DIAGNOSIS — E039 Hypothyroidism, unspecified: Secondary | ICD-10-CM

## 2023-01-03 ENCOUNTER — Other Ambulatory Visit: Payer: Self-pay | Admitting: Family Medicine

## 2023-01-03 DIAGNOSIS — E039 Hypothyroidism, unspecified: Secondary | ICD-10-CM

## 2023-05-21 DIAGNOSIS — R946 Abnormal results of thyroid function studies: Secondary | ICD-10-CM | POA: Diagnosis not present

## 2023-07-04 ENCOUNTER — Emergency Department (HOSPITAL_COMMUNITY)
Admission: EM | Admit: 2023-07-04 | Discharge: 2023-07-04 | Disposition: A | Discharge status: Home / Self Care | Payer: No Typology Code available for payment source | Attending: Emergency Medicine | Admitting: Emergency Medicine

## 2023-07-04 ENCOUNTER — Emergency Department (HOSPITAL_COMMUNITY): Payer: No Typology Code available for payment source

## 2023-07-04 ENCOUNTER — Encounter (HOSPITAL_COMMUNITY): Payer: Self-pay

## 2023-07-04 ENCOUNTER — Other Ambulatory Visit: Payer: Self-pay

## 2023-07-04 DIAGNOSIS — S8001XA Contusion of right knee, initial encounter: Secondary | ICD-10-CM | POA: Insufficient documentation

## 2023-07-04 DIAGNOSIS — S8991XA Unspecified injury of right lower leg, initial encounter: Secondary | ICD-10-CM | POA: Diagnosis not present

## 2023-07-04 DIAGNOSIS — Z043 Encounter for examination and observation following other accident: Secondary | ICD-10-CM | POA: Diagnosis not present

## 2023-07-04 DIAGNOSIS — W19XXXA Unspecified fall, initial encounter: Secondary | ICD-10-CM

## 2023-07-04 DIAGNOSIS — W010XXA Fall on same level from slipping, tripping and stumbling without subsequent striking against object, initial encounter: Secondary | ICD-10-CM | POA: Diagnosis not present

## 2023-07-04 DIAGNOSIS — S0990XA Unspecified injury of head, initial encounter: Secondary | ICD-10-CM | POA: Diagnosis not present

## 2023-07-04 DIAGNOSIS — M25561 Pain in right knee: Secondary | ICD-10-CM

## 2023-07-04 DIAGNOSIS — M79642 Pain in left hand: Secondary | ICD-10-CM | POA: Insufficient documentation

## 2023-07-04 LAB — PREGNANCY, URINE: Preg Test, Ur: NEGATIVE

## 2023-07-04 MED ORDER — ACETAMINOPHEN 500 MG PO TABS
1000.0000 mg | ORAL_TABLET | ORAL | Status: AC
  Administered 2023-07-04: 1000 mg via ORAL
  Filled 2023-07-04: qty 2

## 2023-07-04 NOTE — ED Provider Notes (Signed)
[facility] EMERGENCY DEPARTMENT AT [facility] Provider Note   CSN: [id number] Arrival date & time: 07/04/23  4098     History  Chief Complaint  Patient presents with   Claudia Davis is a 32 y.o. female.  32 year old female who presents emergency department with headache, left hand and right knee pain after a fall.  Was working at [employer] outside when she slipped on a piece of ice.  Did hit her head.  No LOC.  No vomiting since.  No confusion.  Says that she has mild pain of her neck on both sides.  Also landed on her left hand and has an abrasion to the dorsum of her left hand.  Did strike her right knee as well but has been able to bear weight since then.  Last tetanus shot was in 2021.  Not on blood thinners.       Home Medications Prior to Admission medications   Medication Sig Start Date End Date Taking? Authorizing Provider  acetaminophen (TYLENOL) 325 MG tablet Take 2 tablets (650 mg total) by mouth every 4 (four) hours as needed (for pain scale < 4). 04/12/21   Allayne Stack, DO  Ferrous Sulfate (IRON PO) Take by mouth.    [provider]  FLUoxetine (PROZAC) 20 MG capsule TAKE 1 CAPSULE BY MOUTH EVERY DAY 06/02/21   Constant, Gigi Gin, MD  ibuprofen (ADVIL) 600 MG tablet Take 1 tablet (600 mg total) by mouth every 6 (six) hours as needed for moderate pain or cramping. 03/21/22   Garnette Gunner, MD  levothyroxine (SYNTHROID) 100 MCG tablet TAKE 1 TABLET BY MOUTH DAILY BEFORE BREAKFAST. 12/20/22   Mliss Sax, MD  medroxyPROGESTERone Acetate 150 MG/ML SUSY Inject 1 mL (150 mg total) into the muscle every 3 (three) months. 07/04/21   Constant, Peggy, MD  ondansetron (ZOFRAN) 4 MG tablet Take 1 tablet (4 mg total) by mouth every 8 (eight) hours as needed for nausea or vomiting. 03/21/22   Garnette Gunner, MD  Prenatal Multivit-Min-Fe-FA (PRE-NATAL FORMULA) TABS Take one daily. Patient not taking: Reported on 03/21/2022 09/23/19    Mliss Sax, MD  sertraline (ZOLOFT) 50 MG tablet Take 1 tablet (50 mg total) by mouth daily. 05/26/21   Brock Bad, MD      Allergies    Amoxicillin    Review of Systems   Review of Systems  Physical Exam Updated Vital Signs BP 115/77   Pulse 92   Temp 98.2 F (36.8 C) (Oral)   Resp 18   Ht 4\' 10"  (1.473 m)   Wt 69.9 kg   SpO2 97%   BMI 32.19 kg/m  Physical Exam Vitals and nursing note reviewed.  Constitutional:      General: She is not in acute distress.    Appearance: Normal appearance. She is well-developed. She is not ill-appearing.  HENT:     Head: Normocephalic and atraumatic.     Comments: No Battle sign or raccoon eyes.  No significant hematoma at the site of head strike.    Right Ear: Tympanic membrane, ear canal and external ear normal.     Left Ear: Tympanic membrane, ear canal and external ear normal.     Nose: Nose normal.     Mouth/Throat:     Mouth: Mucous membranes are moist.     Pharynx: Oropharynx is clear.  Eyes:     Extraocular Movements: Extraocular movements intact.     Conjunctiva/sclera:  Conjunctivae normal.     Pupils: Pupils are equal, round, and reactive to light.     Comments: Pupils 6 mm and reactive bilaterally  Neck:     Comments: No midline tenderness to palpation.  Bilateral paraspinal tenderness to palpation Cardiovascular:     Rate and Rhythm: Normal rate and regular rhythm.     Pulses: Normal pulses.     Heart sounds: Normal heart sounds. No murmur heard. Pulmonary:     Effort: Pulmonary effort is normal. No respiratory distress.     Breath sounds: Normal breath sounds.  Abdominal:     General: Abdomen is flat. There is no distension.     Palpations: Abdomen is soft. There is no mass.     Tenderness: There is no abdominal tenderness. There is no guarding.  Musculoskeletal:        General: No deformity. Normal range of motion.     Cervical back: Normal range of motion and neck supple. No rigidity or  tenderness.     Right lower leg: No edema.     Left lower leg: No edema.     Comments: Bruising over right patella.  Abrasion of the left hand on the dorsal aspect.  No cervical, thoracic, or lumbar midline spinal tenderness to palpation or step-offs noted.  No chest wall or clavicular tenderness to palpation.  No tenderness to palpation of bilateral shoulders, elbows, or wrists.  No tenderness to palpation of bilateral ankles.  Tenderness to palpation of left hip and right knee.  Skin:    General: Skin is warm and dry.  Neurological:     General: No focal deficit present.     Mental Status: She is alert and oriented to person, place, and time. Mental status is at baseline.     Cranial Nerves: No cranial nerve deficit.     Sensory: No sensory deficit.     Motor: No weakness.  Psychiatric:        Mood and Affect: Mood normal.     ED Results / Procedures / Treatments   Labs (all labs ordered are listed, but only abnormal results are displayed) Labs Reviewed  PREGNANCY, URINE    EKG None  Radiology DG Pelvis 1-2 Views  Result Date: 07/04/2023 CLINICAL DATA:  32 year old female with pain after fall. EXAM: PELVIS - 1-2 VIEW COMPARISON:  Report of CT Abdomen and Pelvis 02/24/2019 (no images available). FINDINGS: Bone mineralization is within normal limits. Femoral heads are normally located. Bony pelvis appears intact. SI joints appear symmetric. Dystrophic calcifications in the left hemipelvis could be combination of vascular phleboliths and fibroid calcification. Right lower quadrant staple line associated with bowel. Nonobstructed pattern. IMPRESSION: No acute fracture or dislocation identified about the pelvis. Electronically Signed   By: Odessa Fleming M.D.   On: 07/04/2023 11:46   DG Hand Complete Left  Result Date: 07/04/2023 CLINICAL DATA:  32 year old female with pain after fall. EXAM: LEFT HAND - COMPLETE 3+ VIEW COMPARISON:  None Available. FINDINGS: Bone mineralization is within  normal limits. There is no evidence of fracture or dislocation. There is no evidence of arthropathy or other focal bone abnormality. No discrete soft tissue injury. IMPRESSION: Negative. Electronically Signed   By: Odessa Fleming M.D.   On: 07/04/2023 11:45   DG Knee Complete 4 Views Right  Result Date: 07/04/2023 CLINICAL DATA:  32 year old female with pain after fall. EXAM: RIGHT KNEE - COMPLETE 4+ VIEW COMPARISON:  None Available. FINDINGS: Bone mineralization is within normal limits. No  evidence of fracture, dislocation, or joint effusion. No evidence of arthropathy or other focal bone abnormality. No discrete soft tissue injury. IMPRESSION: Negative. Electronically Signed   By: Odessa Fleming M.D.   On: 07/04/2023 11:44    Procedures Procedures    Medications Ordered in ED Medications  acetaminophen (TYLENOL) tablet 1,000 mg (1,000 mg Oral Given 07/04/23 1104)    ED Course/ Medical Decision Making/ A&P                                 Medical Decision Making Amount and/or Complexity of Data Reviewed Labs: ordered. Radiology: ordered.  Risk OTC drugs.   Claudia Davis is a 32 y.o. female with comorbidities that complicate the patient evaluation including headache, left hand and right knee pain after a fall.    Initial Ddx:  TBI, concussion, C-spine injury, fracture  MDM/Course:  Patient presents to the emergency department after ground-level fall outside.  Did slipped on some ice and hit her head.  No LOC.  Not on blood thinners.  Is having some paraspinal neck tenderness to palpation but no signs of severe head trauma or midline C-spine tenderness to palpation.  She has a nonfocal neuroexam.  Did consider cross-sectional imaging and CT of the head and C-spine but she is [place] head CT rule negative so do not feel that is indicated.  Also is Nexus negative so do not feel that she needs a CT of the neck.  Did obtain plain films of the areas that were bothering her which did not show any  evidence of fracture.  No signs of a concussion at this time.  Upon re-evaluation remained stable.  Was discharged home with instructions to ice and elevate any affected extremities and to take Tylenol and ibuprofen for pain.  This patient presents to the ED for concern of complaints listed in HPI, this involves an extensive number of treatment options, and is a complaint that carries with it a high risk of complications and morbidity. Disposition including potential need for admission considered.   Dispo: DC Home. Return precautions discussed including, but not limited to, those listed in the AVS. Allowed pt time to ask questions which were answered fully prior to dc.  Records reviewed Outpatient Clinic Notes I independently reviewed the following imaging with scope of interpretation limited to determining acute life threatening conditions related to emergency care: Extremity x-ray(s) and agree with the radiologist interpretation with the following exceptions: none I have reviewed the patients home medications and made adjustments as needed  Portions of this note were generated with Dragon dictation software. Dictation errors may occur despite best attempts at proofreading.     Final Clinical Impression(s) / ED Diagnoses Final diagnoses:  Fall, initial encounter  Minor head injury, initial encounter  Acute pain of right knee  Pain of left hand    Rx / DC Orders ED Discharge Orders     None         Rondel Baton, MD 07/04/23 1246

## 2023-07-04 NOTE — Discharge Instructions (Signed)
You were seen for your fall in the emergency department.   At home, please take Tylenol and ibuprofen for your pain.    Check your MyChart online for the results of any tests that had not resulted by the time you left the emergency department.   Follow-up with your primary doctor in 2-3 days regarding your visit.    Return immediately to the emergency department if you experience any of the following: Severe headache, uncontrolled vomiting, or any other concerning symptoms.    Thank you for visiting our Emergency Department. It was a pleasure taking care of you today.

## 2023-07-04 NOTE — ED Triage Notes (Signed)
Patient arrived via EMS after sustaining a fall at work. Pt reports slipping on ice and falling and hitting head. Denies any blood thinners, no LOC. Complains of head pain more to the base of head and neck, as well as left side of head.

## 2023-07-20 DIAGNOSIS — R946 Abnormal results of thyroid function studies: Secondary | ICD-10-CM | POA: Diagnosis not present
